# Patient Record
Sex: Female | Born: 1985 | Race: Black or African American | Hispanic: No | Marital: Single | Smoking: Never smoker
Health system: Southern US, Community
[De-identification: ages and names within clinical notes are randomized; demographics above are authoritative.]

## PROBLEM LIST (undated history)

## (undated) ENCOUNTER — Inpatient Hospital Stay (HOSPITAL_COMMUNITY): Payer: Self-pay

## (undated) DIAGNOSIS — O036 Delayed or excessive hemorrhage following complete or unspecified spontaneous abortion: Secondary | ICD-10-CM

## (undated) DIAGNOSIS — Z8619 Personal history of other infectious and parasitic diseases: Secondary | ICD-10-CM

## (undated) DIAGNOSIS — O24419 Gestational diabetes mellitus in pregnancy, unspecified control: Secondary | ICD-10-CM

## (undated) DIAGNOSIS — N83209 Unspecified ovarian cyst, unspecified side: Secondary | ICD-10-CM

## (undated) HISTORY — DX: Personal history of other infectious and parasitic diseases: Z86.19

## (undated) HISTORY — PX: DILATION AND EVACUATION: SHX1459

## (undated) HISTORY — PX: INDUCED ABORTION: SHX677

---
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
## 2007-11-15 ENCOUNTER — Inpatient Hospital Stay (HOSPITAL_COMMUNITY): Admission: AD | Admit: 2007-11-15 | Discharge: 2007-11-15 | Payer: Self-pay | Admitting: Obstetrics

## 2008-05-02 ENCOUNTER — Inpatient Hospital Stay (HOSPITAL_COMMUNITY): Admission: AD | Admit: 2008-05-02 | Discharge: 2008-05-05 | Payer: Self-pay | Admitting: Obstetrics

## 2008-10-18 ENCOUNTER — Emergency Department (HOSPITAL_COMMUNITY): Admission: EM | Admit: 2008-10-18 | Discharge: 2008-10-18 | Payer: Self-pay | Admitting: Family Medicine

## 2009-11-30 ENCOUNTER — Emergency Department (HOSPITAL_COMMUNITY): Admission: EM | Admit: 2009-11-30 | Discharge: 2009-12-01 | Payer: Self-pay | Admitting: Emergency Medicine

## 2009-12-25 ENCOUNTER — Emergency Department (HOSPITAL_COMMUNITY): Admission: EM | Admit: 2009-12-25 | Discharge: 2009-12-25 | Payer: Self-pay | Admitting: Family Medicine

## 2010-07-07 NOTE — L&D Delivery Note (Signed)
Delivery Note At 1:52 PM a viable female was delivered via Vaginal, Spontaneous Delivery (Presentation: ;  ).  APGAR: , ; weight .   Placenta status: , .  Cord:  with the following complications: .  Cord pH: not done  Anesthesia:   Episiotomy:  Lacerations:  Suture Repair: 2.0 Est. Blood Loss (mL):   Mom to postpartum.  Baby to nursery-stable.  MARSHALL,BERNARD A 06/19/2011, 2:01 PM

## 2010-09-23 LAB — URINALYSIS, ROUTINE W REFLEX MICROSCOPIC
Glucose, UA: NEGATIVE mg/dL
Ketones, ur: 15 mg/dL — AB
pH: 6 (ref 5.0–8.0)

## 2010-09-23 LAB — CBC
HCT: 33.7 % — ABNORMAL LOW (ref 36.0–46.0)
Hemoglobin: 11.6 g/dL — ABNORMAL LOW (ref 12.0–15.0)
MCHC: 34.6 g/dL (ref 30.0–36.0)
MCV: 90.6 fL (ref 78.0–100.0)
RDW: 14.2 % (ref 11.5–15.5)

## 2010-09-23 LAB — DIFFERENTIAL
Basophils Absolute: 0 10*3/uL (ref 0.0–0.1)
Basophils Relative: 0 % (ref 0–1)
Eosinophils Relative: 0 % (ref 0–5)
Monocytes Absolute: 0.5 10*3/uL (ref 0.1–1.0)

## 2010-09-23 LAB — BASIC METABOLIC PANEL
CO2: 24 mEq/L (ref 19–32)
Glucose, Bld: 106 mg/dL — ABNORMAL HIGH (ref 70–99)
Potassium: 3.6 mEq/L (ref 3.5–5.1)
Sodium: 134 mEq/L — ABNORMAL LOW (ref 135–145)

## 2010-09-23 LAB — WET PREP, GENITAL: Yeast Wet Prep HPF POC: NONE SEEN

## 2010-09-23 LAB — POCT PREGNANCY, URINE: Preg Test, Ur: NEGATIVE

## 2010-09-23 LAB — URINE CULTURE

## 2010-09-23 LAB — URINE MICROSCOPIC-ADD ON

## 2010-11-04 ENCOUNTER — Inpatient Hospital Stay (HOSPITAL_COMMUNITY)
Admission: AD | Admit: 2010-11-04 | Discharge: 2010-11-04 | Disposition: A | Discharge status: Home / Self Care | Payer: Medicaid Other | Source: Ambulatory Visit | Attending: Obstetrics | Admitting: Obstetrics

## 2010-11-04 ENCOUNTER — Inpatient Hospital Stay (HOSPITAL_COMMUNITY): Payer: Medicaid Other

## 2010-11-04 DIAGNOSIS — R109 Unspecified abdominal pain: Secondary | ICD-10-CM | POA: Insufficient documentation

## 2010-11-04 DIAGNOSIS — O2390 Unspecified genitourinary tract infection in pregnancy, unspecified trimester: Secondary | ICD-10-CM | POA: Insufficient documentation

## 2010-11-04 DIAGNOSIS — B373 Candidiasis of vulva and vagina: Secondary | ICD-10-CM

## 2010-11-04 DIAGNOSIS — B3731 Acute candidiasis of vulva and vagina: Secondary | ICD-10-CM | POA: Insufficient documentation

## 2010-11-04 LAB — COMPREHENSIVE METABOLIC PANEL
ALT: 15 U/L (ref 0–35)
Albumin: 3.9 g/dL (ref 3.5–5.2)
Alkaline Phosphatase: 36 U/L — ABNORMAL LOW (ref 39–117)
Calcium: 9.2 mg/dL (ref 8.4–10.5)
GFR calc Af Amer: >60 mL/min (ref >60)
Glucose, Bld: 66 mg/dL — ABNORMAL LOW (ref 70–99)
Potassium: 3.7 mEq/L (ref 3.5–5.1)
Sodium: 135 mEq/L (ref 135–145)
Total Protein: 7.2 g/dL (ref 6.0–8.3)

## 2010-11-04 LAB — CBC
Platelets: 166 10*3/uL (ref 150–400)
RBC: 4.01 MIL/uL (ref 3.87–5.11)
RDW: 12 % (ref 11.5–15.5)
WBC: 7 10*3/uL (ref 4.0–10.5)

## 2010-11-04 LAB — URINALYSIS, ROUTINE W REFLEX MICROSCOPIC
Ketones, ur: NEGATIVE mg/dL
Nitrite: NEGATIVE
Protein, ur: NEGATIVE mg/dL
Urobilinogen, UA: 0.2 mg/dL (ref 0.0–1.0)
pH: 7.5 (ref 5.0–8.0)

## 2010-11-04 LAB — WET PREP, GENITAL
Clue Cells Wet Prep HPF POC: NONE SEEN
Trich, Wet Prep: NONE SEEN

## 2010-11-04 LAB — POCT PREGNANCY, URINE: Preg Test, Ur: POSITIVE

## 2010-11-04 LAB — HCG, QUANTITATIVE, PREGNANCY: hCG, Beta Chain, Quant, S: 82630 m[IU]/mL — ABNORMAL HIGH (ref <5)

## 2010-11-04 LAB — ABO/RH: ABO/RH(D): A POS

## 2010-11-05 LAB — URINE CULTURE: Colony Count: 45000

## 2010-11-27 ENCOUNTER — Inpatient Hospital Stay (HOSPITAL_COMMUNITY)
Admission: AD | Admit: 2010-11-27 | Discharge: 2010-11-27 | Disposition: A | Discharge status: Home / Self Care | Payer: Medicaid Other | Source: Ambulatory Visit | Attending: Obstetrics | Admitting: Obstetrics

## 2010-11-27 DIAGNOSIS — O2390 Unspecified genitourinary tract infection in pregnancy, unspecified trimester: Secondary | ICD-10-CM

## 2010-11-27 DIAGNOSIS — N390 Urinary tract infection, site not specified: Secondary | ICD-10-CM

## 2010-11-27 DIAGNOSIS — R109 Unspecified abdominal pain: Secondary | ICD-10-CM

## 2010-11-27 LAB — DIFFERENTIAL
Basophils Absolute: 0 10*3/uL (ref 0.0–0.1)
Basophils Relative: 0 % (ref 0–1)
Lymphocytes Relative: 21 % (ref 12–46)
Neutro Abs: 4.7 10*3/uL (ref 1.7–7.7)
Neutrophils Relative %: 71 % (ref 43–77)

## 2010-11-27 LAB — CBC
HCT: 38.1 % (ref 36.0–46.0)
Hemoglobin: 13.1 g/dL (ref 12.0–15.0)
RBC: 4.1 MIL/uL (ref 3.87–5.11)
WBC: 6.6 10*3/uL (ref 4.0–10.5)

## 2010-11-27 LAB — COMPREHENSIVE METABOLIC PANEL
ALT: 13 U/L (ref 0–35)
AST: 15 U/L (ref 0–37)
Alkaline Phosphatase: 36 U/L — ABNORMAL LOW (ref 39–117)
CO2: 25 mEq/L (ref 19–32)
Calcium: 9.6 mg/dL (ref 8.4–10.5)
Chloride: 98 mEq/L (ref 96–112)
GFR calc Af Amer: >60 mL/min (ref >60)
GFR calc non Af Amer: >60 mL/min (ref >60)
Potassium: 4.1 mEq/L (ref 3.5–5.1)
Sodium: 133 mEq/L — ABNORMAL LOW (ref 135–145)
Total Bilirubin: 0.6 mg/dL (ref 0.3–1.2)

## 2010-11-27 LAB — URINALYSIS, ROUTINE W REFLEX MICROSCOPIC
Bilirubin Urine: NEGATIVE
Ketones, ur: 15 mg/dL — AB
Specific Gravity, Urine: 1.025 (ref 1.005–1.030)
Urobilinogen, UA: 0.2 mg/dL (ref 0.0–1.0)
pH: 6.5 (ref 5.0–8.0)

## 2010-11-27 LAB — URINE MICROSCOPIC-ADD ON

## 2010-11-28 LAB — URINE CULTURE: Culture  Setup Time: 201205231905

## 2011-02-11 ENCOUNTER — Other Ambulatory Visit (HOSPITAL_COMMUNITY): Payer: Self-pay | Admitting: Obstetrics

## 2011-02-11 DIAGNOSIS — Z3689 Encounter for other specified antenatal screening: Secondary | ICD-10-CM

## 2011-02-11 LAB — RPR: RPR: NONREACTIVE

## 2011-02-11 LAB — ANTIBODY SCREEN: Antibody Screen: NEGATIVE

## 2011-02-13 ENCOUNTER — Ambulatory Visit (HOSPITAL_COMMUNITY)
Admission: RE | Admit: 2011-02-13 | Discharge: 2011-02-13 | Disposition: A | Discharge status: Home / Self Care | Payer: Medicaid Other | Source: Ambulatory Visit | Attending: Obstetrics | Admitting: Obstetrics

## 2011-02-13 DIAGNOSIS — Z1389 Encounter for screening for other disorder: Secondary | ICD-10-CM | POA: Insufficient documentation

## 2011-02-13 DIAGNOSIS — Z3689 Encounter for other specified antenatal screening: Secondary | ICD-10-CM

## 2011-02-13 DIAGNOSIS — O358XX0 Maternal care for other (suspected) fetal abnormality and damage, not applicable or unspecified: Secondary | ICD-10-CM | POA: Insufficient documentation

## 2011-02-13 DIAGNOSIS — Z363 Encounter for antenatal screening for malformations: Secondary | ICD-10-CM | POA: Insufficient documentation

## 2011-04-05 ENCOUNTER — Inpatient Hospital Stay (HOSPITAL_COMMUNITY)
Admission: AD | Admit: 2011-04-05 | Discharge: 2011-04-05 | Disposition: A | Discharge status: Home / Self Care | Payer: Medicaid Other | Source: Ambulatory Visit | Attending: Obstetrics | Admitting: Obstetrics

## 2011-04-05 ENCOUNTER — Encounter (HOSPITAL_COMMUNITY): Payer: Self-pay

## 2011-04-05 DIAGNOSIS — J069 Acute upper respiratory infection, unspecified: Secondary | ICD-10-CM | POA: Insufficient documentation

## 2011-04-05 DIAGNOSIS — O99891 Other specified diseases and conditions complicating pregnancy: Secondary | ICD-10-CM | POA: Insufficient documentation

## 2011-04-05 DIAGNOSIS — Z331 Pregnant state, incidental: Secondary | ICD-10-CM

## 2011-04-05 DIAGNOSIS — J329 Chronic sinusitis, unspecified: Secondary | ICD-10-CM | POA: Insufficient documentation

## 2011-04-05 HISTORY — DX: Delayed or excessive hemorrhage following complete or unspecified spontaneous abortion: O03.6

## 2011-04-05 LAB — URINE MICROSCOPIC-ADD ON

## 2011-04-05 LAB — URINALYSIS, ROUTINE W REFLEX MICROSCOPIC
Glucose, UA: NEGATIVE mg/dL
Protein, ur: NEGATIVE mg/dL
Specific Gravity, Urine: 1.015 (ref 1.005–1.030)

## 2011-04-05 MED ORDER — CETIRIZINE HCL 10 MG PO CHEW: 10.0000 mg | CHEWABLE_TABLET | Freq: Every day | ORAL | Status: DC

## 2011-04-05 MED ORDER — AZITHROMYCIN 250 MG PO TABS: ORAL_TABLET | ORAL | Status: AC

## 2011-04-05 MED ORDER — BENZONATATE 100 MG PO CAPS: 200.0000 mg | ORAL_CAPSULE | Freq: Two times a day (BID) | ORAL | Status: AC | PRN

## 2011-04-05 NOTE — ED Provider Notes (Signed)
Chief Complaint:  Nasal Congestion, Chest Pain and Cough   Shelley Bass is  25 y.o. [id number].  No LMP recorded. Patient is pregnant..  Her pregnancy status is positive. [redacted]w[redacted]d  She presents complaining of Nasal Congestion, Chest Pain and Cough . Onset is described as gradual and has been present for  2 weeks. Cough and cold symptoms x 2 weeks without relief from OTC meds.    Obstetrical/Gynecological History: OB History    Grav Para Term Preterm Abortions TAB SAB Ect Mult Living   2    1 1    1       Past Medical History: Past Medical History  Diagnosis Date  . Abortion complicated with hemorrhage     Past Surgical History: Past Surgical History  Procedure Date  . Induced abortion     surgical  . Dilation and evacuation     post TAB for complications    Family History: No family history on file.  Social History: History  Substance Use Topics  . Smoking status: Never Smoker   . Smokeless tobacco: Never Used  . Alcohol Use: No    Allergies:  Allergies  Allergen Reactions  . Aspirin Hives    Prescriptions prior to admission  Medication Sig Dispense Refill  . acetaminophen-codeine (TYLENOL #3) 300-30 MG per tablet Take 1 tablet by mouth every 4 (four) hours as needed. For pain       . prenatal vitamin w/FE, FA (PRENATAL 1 + 1) 27-1 MG TABS Take 1 tablet by mouth daily.         Review of Systems - ENT ROS: positive for - nasal congestion, nasal discharge, sinus pain and tinnitus Allergy and Immunology ROS: positive for - nasal congestion and postnasal drip Respiratory ROS: positive for - cough Cardiovascular ROS: no chest pain or dyspnea on exertion  Physical Exam   Blood pressure 123/69, pulse 105, temperature 98.6 F (37 C), temperature source Oral, resp. rate 18, height 5\' 4"  (1.626 m), weight 61.417 kg (135 lb 6.4 oz), SpO2 97.00%.  General: General appearance - alert, well appearing, and in no distress, oriented to person, place, and time and normal  appearing weight Mental status - alert, oriented to person, place, and time, normal mood, behavior, speech, dress, motor activity, and thought processes, affect appropriate to mood Eyes - allergy eyes, with dark circles Ears - Fluid noted behind BL TM, + light reflex, nonbulging Nose - mucosal congestion, mucosal erythema, purulent rhinorrhea and sinus tenderness noted maxillary sinus Chest - clear to auscultation, no wheezes, rales or rhonchi, symmetric air entry Focused Gynecological Exam: examination not indicated  Labs: Recent Results (from the past 24 hour(s))  URINALYSIS, ROUTINE W REFLEX MICROSCOPIC   Collection Time   04/05/11 11:35 AM      Component Value Range   Color, Urine YELLOW  YELLOW    Appearance HAZY (*) CLEAR    Specific Gravity, Urine 1.015  1.005 - 1.030    pH 8.5 (*) 5.0 - 8.0    Glucose, UA NEGATIVE  NEGATIVE (mg/dL)   Hgb urine dipstick NEGATIVE  NEGATIVE    Bilirubin Urine NEGATIVE  NEGATIVE    Ketones, ur 15 (*) NEGATIVE (mg/dL)   Protein, ur NEGATIVE  NEGATIVE (mg/dL)   Urobilinogen, UA 1.0  0.0 - 1.0 (mg/dL)   Nitrite NEGATIVE  NEGATIVE    Leukocytes, UA SMALL (*) NEGATIVE   URINE MICROSCOPIC-ADD ON   Collection Time   04/05/11 11:35 AM      Component  Value Range   Squamous Epithelial / LPF MANY (*) RARE    WBC, UA 7-10  <3 (WBC/hpf)   RBC / HPF 0-2  <3 (RBC/hpf)   Bacteria, UA MANY (*) RARE    Imaging Studies:  No results found.   Assessment: Sinuitis URI Pregnancy  Plan: Rx Z-Pak and Tessalon Perles  FU with Dr. Gaynell Face as scheduled  Shelley Bass E. 04/05/2011,12:47 PM

## 2011-04-05 NOTE — Progress Notes (Signed)
Taking Tylenol 3 has had congestion for 3 weeks, fever at home 99.9, chest pain, no history of asthma

## 2011-04-07 LAB — CBC
HCT: 37.2
MCHC: 33.7
MCV: 95.9
Platelets: 138 — ABNORMAL LOW
RBC: 3.34 — ABNORMAL LOW
RDW: 13.8
RDW: 14

## 2011-05-21 LAB — STREP B DNA PROBE: GBS: NEGATIVE

## 2011-06-17 ENCOUNTER — Telehealth (HOSPITAL_COMMUNITY): Payer: Self-pay | Admitting: *Deleted

## 2011-06-17 ENCOUNTER — Encounter (HOSPITAL_COMMUNITY): Payer: Self-pay | Admitting: *Deleted

## 2011-06-17 NOTE — Telephone Encounter (Signed)
Preadmission screen  

## 2011-06-19 ENCOUNTER — Inpatient Hospital Stay (HOSPITAL_COMMUNITY): Payer: Medicaid Other | Admitting: Anesthesiology

## 2011-06-19 ENCOUNTER — Inpatient Hospital Stay (HOSPITAL_COMMUNITY)
Admission: RE | Admit: 2011-06-19 | Discharge: 2011-06-21 | DRG: 775 | Disposition: A | Discharge status: Home / Self Care | Payer: Medicaid Other | Source: Ambulatory Visit | Attending: Obstetrics | Admitting: Obstetrics

## 2011-06-19 ENCOUNTER — Encounter (HOSPITAL_COMMUNITY): Payer: Self-pay

## 2011-06-19 ENCOUNTER — Encounter (HOSPITAL_COMMUNITY): Payer: Self-pay | Admitting: Anesthesiology

## 2011-06-19 LAB — CBC
MCV: 90.1 fL (ref 78.0–100.0)
Platelets: 199 10*3/uL (ref 150–400)
RDW: 14.5 % (ref 11.5–15.5)
WBC: 7 10*3/uL (ref 4.0–10.5)

## 2011-06-19 MED ORDER — OXYTOCIN 20 UNITS IN LACTATED RINGERS INFUSION - SIMPLE
1.0000 m[IU]/min | INTRAVENOUS | Status: DC
  Administered 2011-06-19: 1 m[IU]/min via INTRAVENOUS
  Filled 2011-06-19: qty 1000

## 2011-06-19 MED ORDER — FLEET ENEMA 7-19 GM/118ML RE ENEM: 1.0000 | ENEMA | RECTAL | Status: DC | PRN

## 2011-06-19 MED ORDER — FERROUS SULFATE 325 (65 FE) MG PO TABS
325.0000 mg | ORAL_TABLET | Freq: Two times a day (BID) | ORAL | Status: DC
  Administered 2011-06-19 – 2011-06-21 (×4): 325 mg via ORAL
  Filled 2011-06-19 (×4): qty 1

## 2011-06-19 MED ORDER — FENTANYL 2.5 MCG/ML BUPIVACAINE 1/10 % EPIDURAL INFUSION (WH - ANES)
INTRAMUSCULAR | Status: DC | PRN
  Administered 2011-06-19: 14 mL/h via EPIDURAL

## 2011-06-19 MED ORDER — EPHEDRINE 5 MG/ML INJ
10.0000 mg | INTRAVENOUS | Status: DC | PRN
  Filled 2011-06-19: qty 4

## 2011-06-19 MED ORDER — BENZOCAINE-MENTHOL 20-0.5 % EX AERO: 1.0000 "application " | INHALATION_SPRAY | CUTANEOUS | Status: DC | PRN

## 2011-06-19 MED ORDER — ZOLPIDEM TARTRATE 5 MG PO TABS: 5.0000 mg | ORAL_TABLET | Freq: Every evening | ORAL | Status: DC | PRN

## 2011-06-19 MED ORDER — TETANUS-DIPHTH-ACELL PERTUSSIS 5-2.5-18.5 LF-MCG/0.5 IM SUSP: 0.5000 mL | Freq: Once | INTRAMUSCULAR | Status: DC

## 2011-06-19 MED ORDER — IBUPROFEN 600 MG PO TABS: 600.0000 mg | ORAL_TABLET | Freq: Four times a day (QID) | ORAL | Status: DC | PRN

## 2011-06-19 MED ORDER — DIPHENHYDRAMINE HCL 25 MG PO CAPS: 25.0000 mg | ORAL_CAPSULE | Freq: Four times a day (QID) | ORAL | Status: DC | PRN

## 2011-06-19 MED ORDER — FENTANYL 2.5 MCG/ML BUPIVACAINE 1/10 % EPIDURAL INFUSION (WH - ANES)
14.0000 mL/h | INTRAMUSCULAR | Status: DC
  Filled 2011-06-19: qty 60

## 2011-06-19 MED ORDER — ONDANSETRON HCL 4 MG/2ML IJ SOLN: 4.0000 mg | INTRAMUSCULAR | Status: DC | PRN

## 2011-06-19 MED ORDER — ONDANSETRON HCL 4 MG/2ML IJ SOLN: 4.0000 mg | Freq: Four times a day (QID) | INTRAMUSCULAR | Status: DC | PRN

## 2011-06-19 MED ORDER — WITCH HAZEL-GLYCERIN EX PADS: 1.0000 "application " | MEDICATED_PAD | CUTANEOUS | Status: DC | PRN

## 2011-06-19 MED ORDER — OXYTOCIN BOLUS FROM INFUSION
500.0000 mL | Freq: Once | INTRAVENOUS | Status: DC
  Filled 2011-06-19: qty 500

## 2011-06-19 MED ORDER — LACTATED RINGERS IV SOLN: 500.0000 mL | INTRAVENOUS | Status: DC | PRN

## 2011-06-19 MED ORDER — DIPHENHYDRAMINE HCL 50 MG/ML IJ SOLN: 12.5000 mg | INTRAMUSCULAR | Status: DC | PRN

## 2011-06-19 MED ORDER — SIMETHICONE 80 MG PO CHEW: 80.0000 mg | CHEWABLE_TABLET | ORAL | Status: DC | PRN

## 2011-06-19 MED ORDER — DIBUCAINE 1 % RE OINT: 1.0000 "application " | TOPICAL_OINTMENT | RECTAL | Status: DC | PRN

## 2011-06-19 MED ORDER — PHENYLEPHRINE 40 MCG/ML (10ML) SYRINGE FOR IV PUSH (FOR BLOOD PRESSURE SUPPORT)
80.0000 ug | PREFILLED_SYRINGE | INTRAVENOUS | Status: DC | PRN
  Filled 2011-06-19: qty 5

## 2011-06-19 MED ORDER — EPHEDRINE 5 MG/ML INJ: 10.0000 mg | INTRAVENOUS | Status: DC | PRN

## 2011-06-19 MED ORDER — LIDOCAINE HCL (PF) 1 % IJ SOLN
30.0000 mL | INTRAMUSCULAR | Status: DC | PRN
  Filled 2011-06-19: qty 30

## 2011-06-19 MED ORDER — TERBUTALINE SULFATE 1 MG/ML IJ SOLN: 0.2500 mg | Freq: Once | INTRAMUSCULAR | Status: DC | PRN

## 2011-06-19 MED ORDER — OXYCODONE-ACETAMINOPHEN 5-325 MG PO TABS
1.0000 | ORAL_TABLET | ORAL | Status: DC | PRN
Start: 2011-06-19 — End: 2011-06-21
  Administered 2011-06-19 – 2011-06-21 (×6): 1 via ORAL
  Filled 2011-06-19 (×2): qty 1

## 2011-06-19 MED ORDER — OXYTOCIN 20 UNITS IN LACTATED RINGERS INFUSION - SIMPLE: 125.0000 mL/h | Freq: Once | INTRAVENOUS | Status: DC

## 2011-06-19 MED ORDER — PHENYLEPHRINE 40 MCG/ML (10ML) SYRINGE FOR IV PUSH (FOR BLOOD PRESSURE SUPPORT): 80.0000 ug | PREFILLED_SYRINGE | INTRAVENOUS | Status: DC | PRN

## 2011-06-19 MED ORDER — CITRIC ACID-SODIUM CITRATE 334-500 MG/5ML PO SOLN: 30.0000 mL | ORAL | Status: DC | PRN

## 2011-06-19 MED ORDER — BUTORPHANOL TARTRATE 2 MG/ML IJ SOLN: 1.0000 mg | INTRAMUSCULAR | Status: DC | PRN

## 2011-06-19 MED ORDER — LACTATED RINGERS IV SOLN
500.0000 mL | Freq: Once | INTRAVENOUS | Status: AC
  Administered 2011-06-19: 500 mL via INTRAVENOUS

## 2011-06-19 MED ORDER — ACETAMINOPHEN 325 MG PO TABS: 650.0000 mg | ORAL_TABLET | ORAL | Status: DC | PRN

## 2011-06-19 MED ORDER — LACTATED RINGERS IV SOLN
INTRAVENOUS | Status: DC
  Administered 2011-06-19 (×2): via INTRAVENOUS

## 2011-06-19 MED ORDER — IBUPROFEN 600 MG PO TABS
600.0000 mg | ORAL_TABLET | Freq: Four times a day (QID) | ORAL | Status: DC
  Administered 2011-06-19 – 2011-06-21 (×7): 600 mg via ORAL
  Filled 2011-06-19 (×7): qty 1

## 2011-06-19 MED ORDER — OXYCODONE-ACETAMINOPHEN 5-325 MG PO TABS
2.0000 | ORAL_TABLET | ORAL | Status: DC | PRN
  Administered 2011-06-20: 2 via ORAL
  Filled 2011-06-19: qty 1
  Filled 2011-06-19: qty 2
  Filled 2011-06-19 (×3): qty 1

## 2011-06-19 MED ORDER — PRENATAL PLUS 27-1 MG PO TABS
1.0000 | ORAL_TABLET | Freq: Every day | ORAL | Status: DC
  Administered 2011-06-19 – 2011-06-21 (×3): 1 via ORAL
  Filled 2011-06-19 (×3): qty 1

## 2011-06-19 MED ORDER — LANOLIN HYDROUS EX OINT: TOPICAL_OINTMENT | CUTANEOUS | Status: DC | PRN

## 2011-06-19 MED ORDER — SENNOSIDES-DOCUSATE SODIUM 8.6-50 MG PO TABS
2.0000 | ORAL_TABLET | Freq: Every day | ORAL | Status: DC
  Administered 2011-06-19 – 2011-06-20 (×2): 2 via ORAL

## 2011-06-19 MED ORDER — SODIUM BICARBONATE 8.4 % IV SOLN
INTRAVENOUS | Status: DC | PRN
  Administered 2011-06-19: 5 mL via EPIDURAL

## 2011-06-19 MED ORDER — ONDANSETRON HCL 4 MG PO TABS: 4.0000 mg | ORAL_TABLET | ORAL | Status: DC | PRN

## 2011-06-19 NOTE — H&P (Signed)
This is Dr. Francoise Ceo dictating the history and physical on  Shelley Bass she's a 25 year old gravida 3 para 1011 at 40 weeks appendixes 12th 2012 negative GBS she was admitted for induction of the cervix is 4 cm Prudhomme progressed rapidly and had a normal vaginal delivery female 8 and 9 placenta spontaneous no episiotomy or laceration past medical history was negative Puffs Past surgical history negative Social history negative And system review negative And physical exam well-developed female in no distress HEENT negative Lungs clear Breasts no masses Abdomen uterus 20 week size postpartum Legs negative

## 2011-06-19 NOTE — Anesthesia Preprocedure Evaluation (Signed)

## 2011-06-19 NOTE — Anesthesia Procedure Notes (Signed)

## 2011-06-19 NOTE — Consult Note (Signed)
Neonatology Note:  Attendance at Code Apgar:   Our team responded to a Code Apgar call to room # 173 following NSVD, due to infant with apnea. The baby is full term and there were no risk factors for infection, no fetal distress. At about 5-6 minutes, the baby was noted to be apneic with a HR of 80 by OB nurse, so a Code Apgar was called. We arrived at 8 minutes of life, at which time the baby had responded to PPV (done by [facility] nurse) and stimulation. She was pink, breathing regularly, HR good with good perfusion. I observed her for another 3-4 minutes and she remained asymptomatic. Lungs clear to ausc.I spoke with the mother in the DR, then transferred the baby to the Pediatrician's care. Mellody Memos, MD

## 2011-06-20 LAB — CBC
HCT: 31.4 % — ABNORMAL LOW (ref 36.0–46.0)
MCV: 90 fL (ref 78.0–100.0)
RDW: 14.9 % (ref 11.5–15.5)
WBC: 10.1 10*3/uL (ref 4.0–10.5)

## 2011-06-20 NOTE — Progress Notes (Signed)
UR chart review completed.  

## 2011-06-20 NOTE — Anesthesia Postprocedure Evaluation (Signed)
Anesthesia Post Note  Patient: Shelley Bass  Procedure(s) Performed: * No procedures listed *  Anesthesia type: Epidural  Patient location: Mother/Baby  Post pain: Pain level controlled  Post assessment: Post-op Vital signs reviewed  Last Vitals:  Filed Vitals:   06/20/11 0517  BP: 113/70  Pulse: 77  Temp: 36.5 C  Resp: 20    Post vital signs: Reviewed  Level of consciousness:alert  Complications: No apparent anesthesia complications

## 2011-06-21 MED ORDER — MEDROXYPROGESTERONE ACETATE 150 MG/ML IM SUSP
150.0000 mg | Freq: Once | INTRAMUSCULAR | Status: AC
  Administered 2011-06-21: 150 mg via INTRAMUSCULAR
  Filled 2011-06-21: qty 1

## 2011-06-21 MED ORDER — PRENATAL PLUS 27-1 MG PO TABS: 1.0000 | ORAL_TABLET | Freq: Every day | ORAL | Status: DC

## 2011-06-21 MED ORDER — IBUPROFEN 600 MG PO TABS: 600.0000 mg | ORAL_TABLET | Freq: Four times a day (QID) | ORAL | Status: AC

## 2011-06-21 MED ORDER — OXYCODONE-ACETAMINOPHEN 5-325 MG PO TABS: 2.0000 | ORAL_TABLET | Freq: Four times a day (QID) | ORAL | Status: AC | PRN

## 2011-06-21 NOTE — Discharge Summary (Signed)
  Obstetric Discharge Summary Reason for Admission: onset of labor Prenatal Procedures: none Intrapartum Procedures: spontaneous vaginal delivery Postpartum Procedures: none Complications-Operative and Postpartum: none  Hemoglobin  Date Value Range Status  06/20/2011 10.1* 12.0-15.0 (g/dL) Final     HCT  Date Value Range Status  06/20/2011 31.4* 36.0-46.0 (%) Final    Discharge Diagnoses: Term Pregnancy-delivered  Discharge Information: Date: 06/21/2011 Activity: pelvic rest Diet: routine Medications:  Prior to Admission medications   Medication Sig Start Date End Date Taking? Authorizing Provider  ibuprofen (ADVIL,MOTRIN) 600 MG tablet Take 1 tablet (600 mg total) by mouth every 6 (six) hours. 06/21/11 07/01/11  Roseanna Rainbow, MD  oxyCODONE-acetaminophen (PERCOCET) 5-325 MG per tablet Take 2 tablets by mouth every 6 (six) hours as needed (for pain scale > 4). 06/21/11 07/01/11  Roseanna Rainbow, MD  prenatal vitamin w/FE, FA (PRENATAL 1 + 1) 27-1 MG TABS Take 1 tablet by mouth daily. 06/21/11   Roseanna Rainbow, MD    Condition: stable Instructions: refer to routine discharge instructions Discharge to: home Follow-up Information    Follow up with MARSHALL,BERNARD A, MD. Make an appointment in 6 weeks.   Contact information:   [address] [place] [address] [phone]          Newborn Data: Live born  Information for the patient's newborn:  Mack, Girl Caliana [[id number]]  female ; APGAR , ; weight ;  Home with mother.  JACKSON-MOORE,Herminio Kniskern A 06/21/2011, 10:44 AM

## 2011-06-21 NOTE — Progress Notes (Signed)
Post Partum Day #2 S/P:spontaneous vaginal  RH status/Rubella reviewed.  Feeding: breast Subjective: No HA, SOB, CP, F/C, breast symptoms: no. Normal vaginal bleeding, no clots.     Objective:  Blood pressure 106/69, pulse 85, temperature 97.9 F (36.6 C), temperature source Oral, resp. rate 18, height 5\' 4"  (1.626 m), weight 70.308 kg (155 lb), SpO2 97.00%, unknown if currently breastfeeding.   Physical Exam:  General: alert Lochia: appropriate Uterine Fundus: firm DVT Evaluation: No evidence of DVT seen on physical exam. Ext: No c/c/e  Basename 06/20/11 0525 06/19/11 0810  HGB 10.1* 10.9*  HCT 31.4* 33.5*    Assessment/Plan: 25 y.o.  PPD # 2 .  normal postpartum exam patient is a candidate for Depo-Provera for contraception, with no contraindications Continue current postpartum care D/C home   LOS: 2 days   JACKSON-MOORE,Goebel Hellums A 06/21/2011, 10:38 AM

## 2014-05-08 ENCOUNTER — Encounter (HOSPITAL_COMMUNITY): Payer: Self-pay

## 2014-11-01 LAB — HM PAP SMEAR: HM PAP: NEGATIVE

## 2016-02-23 ENCOUNTER — Encounter: Payer: Self-pay | Admitting: *Deleted

## 2016-03-05 ENCOUNTER — Ambulatory Visit (INDEPENDENT_AMBULATORY_CARE_PROVIDER_SITE_OTHER): Payer: BLUE CROSS/BLUE SHIELD | Admitting: Family Medicine

## 2016-03-05 ENCOUNTER — Encounter: Payer: Self-pay | Admitting: Family Medicine

## 2016-03-05 VITALS — BP 132/62 | HR 71 | Temp 98.0°F | Resp 16 | Ht 64.0 in | Wt 152.0 lb

## 2016-03-05 DIAGNOSIS — Z0000 Encounter for general adult medical examination without abnormal findings: Secondary | ICD-10-CM

## 2016-03-05 DIAGNOSIS — IMO0001 Reserved for inherently not codable concepts without codable children: Secondary | ICD-10-CM

## 2016-03-05 NOTE — Progress Notes (Signed)
       Shelley Bass is a 30 y.o. female who [facility] today for well adult visit.  Patient is doing well with no active medical problems. She notes that she's gained some weight last several months which she started a new desk job and has been less active and eating more than usual. She has normal menstrual periods and is not currently using birth control reliably with her long-term partner. She has 2 children and does not mind if she becomes pregnant but is not actively planning to become pregnant. She is not currently taking prenatal vitamins. No fevers chills nausea vomiting or diarrhea.   Past Medical History:  Diagnosis Date  . Abortion complicated with hemorrhage   . History of chlamydia    Past Surgical History:  Procedure Laterality Date  . DILATION AND EVACUATION     post TAB for complications  . INDUCED ABORTION     surgical   Social History  Substance Use Topics  . Smoking status: Never Smoker  . Smokeless tobacco: Never Used  . Alcohol use No   family history includes Arthritis in her mother; Diabetes in her paternal grandmother; Hypertension in her father; Miscarriages / [place] in her mother; Vision loss in her maternal grandmother.  ROS as above: No headache, visual changes, nausea, vomiting, diarrhea, constipation, dizziness, abdominal pain, skin rash, fevers, chills, night sweats, weight loss, swollen lymph nodes, body aches, joint swelling, muscle aches, chest pain, shortness of breath, mood changes, visual or auditory hallucinations.    Medications: Current Outpatient Prescriptions  Medication Sig Dispense Refill  . prenatal vitamin w/FE, FA (PRENATAL 1 + 1) 27-1 MG TABS Take 1 tablet by mouth daily. 30 each 11   No current facility-administered medications for this visit.    Allergies  Allergen Reactions  . Aspirin Hives       Exam:  BP 132/62 (BP Location: Right Arm, Patient Position: Sitting, Cuff Size: Normal)   Pulse 71   Temp 98 F (36.7 C) (Oral)   Resp 16   Ht 5\' 4"  (1.626 m)   Wt 152 lb 0.6 oz (69 kg)   SpO2 98%   BMI 26.10 kg/m  Gen: Well NAD HEENT: EOMI,  MMM Lungs: Normal work of breathing. CTABL Heart: RRR no MRG Abd: NABS, Soft. Nondistended, Nontender Exts: Brisk capillary refill, warm and well perfused.   No results found for this or any previous visit (from the past 24 hour(s)). No results found.    Assessment and Plan: 30 y.o. female with Well adult. Discussed contraceptive methods. Patient elects for none. Recommend taking prenatal vitamins. Check basic fasting labs. Return in one year or sooner if needed.   Orders Placed This Encounter  Procedures  . CBC  . Comprehensive metabolic panel    Order Specific Question:   Has the patient fasted?    Answer:   No  . Lipid panel    Order Specific Question:   Has the patient fasted?    Answer:   No  . Hemoglobin A1c  . VITAMIN D 25 Hydroxy (Vit-D Deficiency, Fractures)    Discussed warning signs or symptoms. Please see discharge instructions. Patient expresses understanding.

## 2016-03-05 NOTE — Patient Instructions (Signed)
Thank you for coming in today. Take prenatal vitamins.  Get fasting labs soon.  Return in 1 year or sooner if needed.  Avoid carbohydrates.

## 2016-03-05 NOTE — Progress Notes (Signed)
Here to establish care

## 2016-07-07 NOTE — L&D Delivery Note (Signed)
Patient was C/C/+3 and pushed for 15 minutes with epidural.   NSVD  female infant, Apgars 8,9, weight P.   The patient had no lacerations. Fundus was firm. EBL was expected amount. Placenta was delivered intact. Vagina was clear.  Baby was vigorous and doing skin to skin with mother.  Ayaan Ringle A

## 2016-08-04 ENCOUNTER — Emergency Department (HOSPITAL_BASED_OUTPATIENT_CLINIC_OR_DEPARTMENT_OTHER): Payer: Medicaid Other

## 2016-08-04 ENCOUNTER — Emergency Department (HOSPITAL_BASED_OUTPATIENT_CLINIC_OR_DEPARTMENT_OTHER)
Admission: EM | Admit: 2016-08-04 | Discharge: 2016-08-04 | Disposition: A | Discharge status: Home / Self Care | Payer: Medicaid Other | Attending: Emergency Medicine | Admitting: Emergency Medicine

## 2016-08-04 ENCOUNTER — Encounter (HOSPITAL_BASED_OUTPATIENT_CLINIC_OR_DEPARTMENT_OTHER): Payer: Self-pay | Admitting: *Deleted

## 2016-08-04 DIAGNOSIS — O209 Hemorrhage in early pregnancy, unspecified: Secondary | ICD-10-CM

## 2016-08-04 DIAGNOSIS — Z3A08 8 weeks gestation of pregnancy: Secondary | ICD-10-CM | POA: Insufficient documentation

## 2016-08-04 DIAGNOSIS — R1030 Lower abdominal pain, unspecified: Secondary | ICD-10-CM | POA: Diagnosis not present

## 2016-08-04 DIAGNOSIS — O200 Threatened abortion: Secondary | ICD-10-CM | POA: Diagnosis not present

## 2016-08-04 LAB — URINALYSIS, ROUTINE W REFLEX MICROSCOPIC
Bilirubin Urine: NEGATIVE
GLUCOSE, UA: NEGATIVE mg/dL
Ketones, ur: NEGATIVE mg/dL
Nitrite: NEGATIVE
PH: 6.5 (ref 5.0–8.0)
Protein, ur: NEGATIVE mg/dL
Specific Gravity, Urine: 1.021 (ref 1.005–1.030)

## 2016-08-04 LAB — HCG, QUANTITATIVE, PREGNANCY: HCG, BETA CHAIN, QUANT, S: 163207 m[IU]/mL — AB (ref <5)

## 2016-08-04 LAB — CBC
HEMATOCRIT: 38 % (ref 36.0–46.0)
Hemoglobin: 12.9 g/dL (ref 12.0–15.0)
MCH: 31.9 pg (ref 26.0–34.0)
MCHC: 33.9 g/dL (ref 30.0–36.0)
MCV: 93.8 fL (ref 78.0–100.0)
PLATELETS: 182 10*3/uL (ref 150–400)
RBC: 4.05 MIL/uL (ref 3.87–5.11)
RDW: 12.4 % (ref 11.5–15.5)
WBC: 8.4 10*3/uL (ref 4.0–10.5)

## 2016-08-04 LAB — BASIC METABOLIC PANEL
ANION GAP: 8 (ref 5–15)
BUN: 9 mg/dL (ref 6–20)
CO2: 24 mmol/L (ref 22–32)
CREATININE: 0.74 mg/dL (ref 0.44–1.00)
Calcium: 9.5 mg/dL (ref 8.9–10.3)
Chloride: 102 mmol/L (ref 101–111)
Glucose, Bld: 88 mg/dL (ref 65–99)
Potassium: 3.7 mmol/L (ref 3.5–5.1)
SODIUM: 134 mmol/L — AB (ref 135–145)

## 2016-08-04 LAB — URINALYSIS, MICROSCOPIC (REFLEX)

## 2016-08-04 LAB — PREGNANCY, URINE: Preg Test, Ur: POSITIVE — AB

## 2016-08-04 MED ORDER — NAT-RUL PRENATAL VITAMINS 28-0.8 MG PO TABS: 1.0000 | ORAL_TABLET | Freq: Every day | ORAL | 3 refills | Status: DC

## 2016-08-04 MED ORDER — PROMETHAZINE HCL 25 MG PO TABS: 25.0000 mg | ORAL_TABLET | Freq: Four times a day (QID) | ORAL | 2 refills | Status: DC | PRN

## 2016-08-04 NOTE — ED Notes (Signed)
Pt returned from US

## 2016-08-04 NOTE — ED Provider Notes (Signed)
[facility] Provider Note   CSN: [id number] Arrival date & time: 08/04/16  1525  By signing my name below, I, Teofilo Pod, attest that this documentation has been prepared under the direction and in the presence of Vanetta Mulders, MD . Electronically Signed: Teofilo Pod, ED Scribe. 08/04/2016. 6:33 PM.    History   Chief Complaint Chief Complaint  Patient presents with  . Abdominal Pain    The history is provided by the patient. No language interpreter was used.   HPI Comments:  Shelley Bass is a 31 y.o. female who presents to the Emergency Department complaining of constant lower abdominal pain x 1 month. Pt complains of associated nausea, vomiting, sore throat, light vaginal discharge, vaginal bleeding x 1 week, hematuria, headache, ankle swelling. LNMP was 06/06/16, and is G4P2A1. Pt had a positive pregnancy test at the ED today. Pt is blood type-A positive. No alleviating factors noted. Pt denies fever, rhinorrhea, visual changes, cough, SOB, chest pain, diarrhea, dysuria, and bleeding easily.    Past Medical History:  Diagnosis Date  . Abortion complicated with hemorrhage   . History of chlamydia     There are no active problems to display for this patient.   Past Surgical History:  Procedure Laterality Date  . DILATION AND EVACUATION     post TAB for complications  . INDUCED ABORTION     surgical    OB History    Gravida Para Term Preterm AB Living   3 2 2  0 1 2   SAB TAB Ectopic Multiple Live Births   0 1 0 0 2       Home Medications    Prior to Admission medications   Medication Sig Start Date End Date Taking? Authorizing Provider  Prenatal Vit-Fe Fumarate-FA (NAT-RUL PRENATAL VITAMINS) 28-0.8 MG TABS Take 1 tablet by mouth daily. 08/04/16   Vanetta Mulders, MD  prenatal vitamin w/FE, FA (PRENATAL 1 + 1) 27-1 MG TABS Take 1 tablet by mouth daily. 06/21/11   Antionette Char, MD  promethazine (PHENERGAN) 25 MG tablet Take  1 tablet (25 mg total) by mouth every 6 (six) hours as needed for nausea or vomiting. 08/04/16   Vanetta Mulders, MD    Family History Family History  Problem Relation Age of Onset  . Arthritis Mother   . Miscarriages / [place] Mother   . Hypertension Father   . Vision loss Maternal Grandmother     cataracts  . Diabetes Paternal Grandmother   . Anesthesia problems Neg Hx   . Hypotension Neg Hx   . Malignant hyperthermia Neg Hx   . Pseudochol deficiency Neg Hx     Social History Social History  Substance Use Topics  . Smoking status: Never Smoker  . Smokeless tobacco: Never Used  . Alcohol use No     Allergies   Aspirin   Review of Systems Review of Systems  Constitutional: Negative for fever.  HENT: Positive for sore throat. Negative for rhinorrhea.   Eyes: Negative for visual disturbance.  Respiratory: Negative for cough and shortness of breath.   Cardiovascular: Positive for leg swelling (Ankles). Negative for chest pain.  Gastrointestinal: Positive for abdominal pain, nausea and vomiting. Negative for diarrhea.  Genitourinary: Positive for hematuria, vaginal bleeding and vaginal discharge. Negative for dysuria.  Neurological: Positive for headaches.  Hematological: Does not bruise/bleed easily.  All other systems reviewed and are negative.    Physical Exam Updated Vital Signs BP 105/60 (BP Location: Right Arm)  Pulse 74   Temp 98.4 F (36.9 C) (Oral)   Resp 18   Ht 5\' 4"  (1.626 m)   Wt 68 kg   LMP 06/06/2016   SpO2 99%   BMI 25.75 kg/m   Physical Exam  Constitutional: She appears well-developed and well-nourished. No distress.  HENT:  Head: Normocephalic and atraumatic.  Mouth/Throat: Oropharynx is clear and moist.  Eyes: Conjunctivae and EOM are normal. Pupils are equal, round, and reactive to light.  Sclera clear, eyes tracking normally.   Neck: Neck supple.  Cardiovascular: Normal rate, regular rhythm and normal heart sounds.   No murmur  heard. Pulmonary/Chest: Effort normal and breath sounds normal. No respiratory distress.  Lungs clear bilaterally.  Abdominal: Soft. Bowel sounds are normal. There is no tenderness.  Musculoskeletal: She exhibits no edema.  Neurological: She is alert. No cranial nerve deficit or sensory deficit. She exhibits normal muscle tone. Coordination normal.  Skin: Skin is warm and dry.  Psychiatric: She has a normal mood and affect.  Nursing note and vitals reviewed.    ED Treatments / Results  DIAGNOSTIC STUDIES:  Oxygen Saturation is 100% on RA, normal by my interpretation.    COORDINATION OF CARE:  6:27 PM Will order ultrasound. Discussed treatment plan with pt at bedside and pt agreed to plan.   Labs (all labs ordered are listed, but only abnormal results are displayed) Labs Reviewed  URINALYSIS, ROUTINE W REFLEX MICROSCOPIC - Abnormal; Notable for the following:       Result Value   APPearance CLOUDY (*)    Hgb urine dipstick TRACE (*)    Leukocytes, UA MODERATE (*)    All other components within normal limits  PREGNANCY, URINE - Abnormal; Notable for the following:    Preg Test, Ur POSITIVE (*)    All other components within normal limits  URINALYSIS, MICROSCOPIC (REFLEX) - Abnormal; Notable for the following:    Bacteria, UA MANY (*)    Squamous Epithelial / LPF 6-30 (*)    All other components within normal limits  HCG, QUANTITATIVE, PREGNANCY - Abnormal; Notable for the following:    hCG, Beta Chain, Quant, S [id number] (*)    All other components within normal limits  BASIC METABOLIC PANEL - Abnormal; Notable for the following:    Sodium 134 (*)    All other components within normal limits  CBC  .Marland Kitchen... Results for orders placed or performed during the hospital encounter of 08/04/16  Urinalysis, Routine w reflex microscopic  Result Value Ref Range   Color, Urine YELLOW YELLOW   APPearance CLOUDY (A) CLEAR   Specific Gravity, Urine 1.021 1.005 - 1.030   pH 6.5 5.0 - 8.0     Glucose, UA NEGATIVE NEGATIVE mg/dL   Hgb urine dipstick TRACE (A) NEGATIVE   Bilirubin Urine NEGATIVE NEGATIVE   Ketones, ur NEGATIVE NEGATIVE mg/dL   Protein, ur NEGATIVE NEGATIVE mg/dL   Nitrite NEGATIVE NEGATIVE   Leukocytes, UA MODERATE (A) NEGATIVE  Pregnancy, urine  Result Value Ref Range   Preg Test, Ur POSITIVE (A) NEGATIVE  Urinalysis, Microscopic (reflex)  Result Value Ref Range   RBC / HPF 0-5 0 - 5 RBC/hpf   WBC, UA 0-5 0 - 5 WBC/hpf   Bacteria, UA MANY (A) NONE SEEN   Squamous Epithelial / LPF 6-30 (A) NONE SEEN   Mucous PRESENT   hCG, quantitative, pregnancy  Result Value Ref Range   hCG, Beta Chain, Quant, S 163,207 (H) <5 mIU/mL  CBC  Result  Value Ref Range   WBC 8.4 4.0 - 10.5 K/uL   RBC 4.05 3.87 - 5.11 MIL/uL   Hemoglobin 12.9 12.0 - 15.0 g/dL   HCT 14.7 82.9 - 56.2 %   MCV 93.8 78.0 - 100.0 fL   MCH 31.9 26.0 - 34.0 pg   MCHC 33.9 30.0 - 36.0 g/dL   RDW 13.0 86.5 - 78.4 %   Platelets 182 150 - 400 K/uL  Basic metabolic panel  Result Value Ref Range   Sodium 134 (L) 135 - 145 mmol/L   Potassium 3.7 3.5 - 5.1 mmol/L   Chloride 102 101 - 111 mmol/L   CO2 24 22 - 32 mmol/L   Glucose, Bld 88 65 - 99 mg/dL   BUN 9 6 - 20 mg/dL   Creatinine, Ser 6.96 0.44 - 1.00 mg/dL   Calcium 9.5 8.9 - 29.5 mg/dL   GFR calc non Af Amer >60 >60 mL/min   GFR calc Af Amer >60 >60 mL/min   Anion gap 8 5 - 15     EKG  EKG Interpretation None       Radiology US Ob Comp Less 14 Wks  Result Date: 08/04/2016 CLINICAL DATA:  Chronic pelvic pain. Acute onset of vaginal bleeding. Initial encounter. EXAM: OBSTETRIC <14 WK ULTRASOUND TECHNIQUE: Transabdominal ultrasound was performed for evaluation of the gestation as well as the maternal uterus and adnexal regions. COMPARISON:  Pelvic ultrasound performed 02/13/2011 FINDINGS: Intrauterine gestational sac: Single; visualized and normal in shape. Yolk sac:  Yes Embryo:  Yes Cardiac Activity: Yes Heart Rate: 171 bpm CRL:    1.7 cm   8 w 2 d                  [place] EDC: 03/14/2017 Subchorionic hemorrhage:  None visualized. Maternal uterus/adnexae: The uterus is otherwise unremarkable in appearance. The cervix remains closed. The ovaries are within normal limits. The right ovary measures 3.8 x 1.6 x 1.6 cm, while the left ovary measures 3.0 x 2.9 x 2.4 cm. No suspicious adnexal masses are seen; there is no evidence for ovarian torsion. No free fluid is seen within the pelvic cul-de-sac. IMPRESSION: Single live intrauterine pregnancy noted, with a crown-rump length of 1.7 cm, corresponding to a gestational age of [redacted] weeks 2 days. This matches the gestational age of [redacted] weeks 3 days by LMP, reflecting an estimated date of delivery of March 13, 2017. Electronically Signed   By: Roanna Raider M.D.   On: 08/04/2016 20:02    Procedures Procedures (including critical care time)  Medications Ordered in ED Medications - No data to display   Initial Impression / Assessment and Plan / ED Course  I have reviewed the triage vital signs and the nursing notes.  Pertinent labs & imaging results that were available during my care of the patient were reviewed by me and considered in my medical decision making (see chart for details).    Urinalysis is not consistent with urinary tract infection however urine culture sent. Patient with positive pregnancy test. Ultrasound shows about an 8 week live intrauterine pregnancy. The patient is having some abdominal pain and bleeding which could possibly mean concerns for threatened miscarriage. No concerns for ectopic pregnancy at this time. Patient will be started on prenatal vitamins and Phenergan for the nausea and vomiting and she'll follow-up with OB/GYN. Work note provided.   Final Clinical Impressions(s) / ED Diagnoses   Final diagnoses:  [redacted] weeks gestation of pregnancy  Threatened miscarriage in  early pregnancy    New Prescriptions New Prescriptions   PRENATAL VIT-FE FUMARATE-FA  (NAT-RUL PRENATAL VITAMINS) 28-0.8 MG TABS    Take 1 tablet by mouth daily.   PROMETHAZINE (PHENERGAN) 25 MG TABLET    Take 1 tablet (25 mg total) by mouth every 6 (six) hours as needed for nausea or vomiting.     Vanetta MuldersScott Anaika Santillano, MD 08/04/16 2039

## 2016-08-04 NOTE — ED Notes (Signed)
Patient transported to Ultrasound 

## 2016-08-04 NOTE — ED Notes (Signed)
Pt verbalizes understanding of d/c instructions and denies any further needs at this time. 

## 2016-08-04 NOTE — ED Notes (Signed)
Pt is A positive blood type per lab

## 2016-08-04 NOTE — ED Triage Notes (Signed)
Lower abdominal pain and vaginal discharge.

## 2016-08-04 NOTE — Discharge Instructions (Signed)
Ultrasound shows a 8 week live pregnancy but due to the bleeding and pain and we call it a threatened miscarriage is a possibility. Make appointment to follow-up with OB/GYN. Take Phenergan as needed for nausea and vomiting. Take prenatal vitamins daily. Return for any new or worse symptoms.

## 2016-08-06 LAB — URINE CULTURE

## 2016-08-08 ENCOUNTER — Inpatient Hospital Stay (HOSPITAL_COMMUNITY)
Admission: AD | Admit: 2016-08-08 | Discharge: 2016-08-08 | Disposition: A | Discharge status: Home / Self Care | Payer: Medicaid Other | Source: Ambulatory Visit | Attending: Obstetrics & Gynecology | Admitting: Obstetrics & Gynecology

## 2016-08-08 ENCOUNTER — Encounter (HOSPITAL_COMMUNITY): Payer: Self-pay | Admitting: *Deleted

## 2016-08-08 DIAGNOSIS — O26891 Other specified pregnancy related conditions, first trimester: Secondary | ICD-10-CM | POA: Diagnosis not present

## 2016-08-08 DIAGNOSIS — R1030 Lower abdominal pain, unspecified: Secondary | ICD-10-CM | POA: Insufficient documentation

## 2016-08-08 DIAGNOSIS — B9689 Other specified bacterial agents as the cause of diseases classified elsewhere: Secondary | ICD-10-CM

## 2016-08-08 DIAGNOSIS — Z3A09 9 weeks gestation of pregnancy: Secondary | ICD-10-CM | POA: Insufficient documentation

## 2016-08-08 DIAGNOSIS — O23591 Infection of other part of genital tract in pregnancy, first trimester: Secondary | ICD-10-CM | POA: Insufficient documentation

## 2016-08-08 DIAGNOSIS — N760 Acute vaginitis: Secondary | ICD-10-CM

## 2016-08-08 DIAGNOSIS — O219 Vomiting of pregnancy, unspecified: Secondary | ICD-10-CM

## 2016-08-08 DIAGNOSIS — O210 Mild hyperemesis gravidarum: Secondary | ICD-10-CM | POA: Diagnosis present

## 2016-08-08 HISTORY — DX: Unspecified ovarian cyst, unspecified side: N83.209

## 2016-08-08 LAB — URINALYSIS, ROUTINE W REFLEX MICROSCOPIC
BILIRUBIN URINE: NEGATIVE
GLUCOSE, UA: NEGATIVE mg/dL
HGB URINE DIPSTICK: NEGATIVE
Ketones, ur: NEGATIVE mg/dL
NITRITE: NEGATIVE
PH: 7 (ref 5.0–8.0)
Protein, ur: NEGATIVE mg/dL
Specific Gravity, Urine: 1.02 (ref 1.005–1.030)

## 2016-08-08 LAB — CBC
HEMATOCRIT: 36.5 % (ref 36.0–46.0)
Hemoglobin: 12.7 g/dL (ref 12.0–15.0)
MCH: 32 pg (ref 26.0–34.0)
MCHC: 34.8 g/dL (ref 30.0–36.0)
MCV: 91.9 fL (ref 78.0–100.0)
Platelets: 207 10*3/uL (ref 150–400)
RBC: 3.97 MIL/uL (ref 3.87–5.11)
RDW: 12.7 % (ref 11.5–15.5)
WBC: 9.8 10*3/uL (ref 4.0–10.5)

## 2016-08-08 LAB — WET PREP, GENITAL
Sperm: NONE SEEN
TRICH WET PREP: NONE SEEN
Yeast Wet Prep HPF POC: NONE SEEN

## 2016-08-08 MED ORDER — METRONIDAZOLE 0.75 % VA GEL: 1.0000 | Freq: Two times a day (BID) | VAGINAL | 0 refills | Status: DC

## 2016-08-08 MED ORDER — PROMETHAZINE HCL 25 MG/ML IJ SOLN
25.0000 mg | Freq: Once | INTRAMUSCULAR | Status: AC
  Administered 2016-08-08: 25 mg via INTRAMUSCULAR
  Filled 2016-08-08: qty 1

## 2016-08-08 NOTE — MAU Note (Addendum)
Vomiting for 3 wks. Abd pain for 2 wks. Spotting when I urinate that is light pink. Was seen ED 4 days ago and was told had blood in urine. Given Phenergan but is not working

## 2016-08-08 NOTE — Discharge Instructions (Signed)
Safe Medications in Pregnancy  ° °Acne: °Benzoyl Peroxide °Salicylic Acid ° °Backache/Headache: °Tylenol: 2 regular strength every 4 hours OR °             2 Extra strength every 6 hours ° °Colds/Coughs/Allergies: °Benadryl (alcohol free) 25 mg every 6 hours as needed °Breath right strips °Claritin °Cepacol throat lozenges °Chloraseptic throat spray °Cold-Eeze- up to three times per day °Cough drops, alcohol free °Flonase (by prescription only) °Guaifenesin °Mucinex °Robitussin DM (plain only, alcohol free) °Saline nasal spray/drops °Sudafed (pseudoephedrine) & Actifed ** use only after [redacted] weeks gestation and if you do not have high blood pressure °Tylenol °Vicks Vaporub °Zinc lozenges °Zyrtec  ° °Constipation: °Colace °Ducolax suppositories °Fleet enema °Glycerin suppositories °Metamucil °Milk of magnesia °Miralax °Senokot °Smooth move tea ° °Diarrhea: °Kaopectate °Imodium A-D ° °*NO pepto Bismol ° °Hemorrhoids: °Anusol °Anusol HC °Preparation H °Tucks ° °Indigestion: °Tums °Maalox °Mylanta °Zantac  °Pepcid ° °Insomnia: °Benadryl (alcohol free) 25mg every 6 hours as needed °Tylenol PM °Unisom, no Gelcaps ° °Leg Cramps: °Tums °MagGel ° °Nausea/Vomiting:  °Bonine °Dramamine °Emetrol °Ginger extract °Sea bands °Meclizine  °Nausea medication to take during pregnancy:  °Unisom (doxylamine succinate 25 mg tablets) Take one tablet daily at bedtime. If symptoms are not adequately controlled, the dose can be increased to a maximum recommended dose of two tablets daily (1/2 tablet in the morning, 1/2 tablet mid-afternoon and one at bedtime). °Vitamin B6 100mg tablets. Take one tablet twice a day (up to 200 mg per day). ° °Skin Rashes: °Aveeno products °Benadryl cream or 25mg every 6 hours as needed °Calamine Lotion °1% cortisone cream ° °Yeast infection: °Gyne-lotrimin 7 °Monistat 7 ° °Gum/tooth pain: °Anbesol ° °**If taking multiple medications, please check labels to avoid duplicating the same active ingredients °**take  medication as directed on the label °** Do not exceed 4000 mg of tylenol in 24 hours °**Do not take medications that contain aspirin or ibuprofen ° ° ° ° ° °Bacterial Vaginosis °Bacterial vaginosis is a vaginal infection that occurs when the normal balance of bacteria in the vagina is disrupted. It results from an overgrowth of certain bacteria. This is the most common vaginal infection among women ages 15-44. °Because bacterial vaginosis increases your risk for STIs (sexually transmitted infections), getting treated can help reduce your risk for chlamydia, gonorrhea, herpes, and HIV (human immunodeficiency virus). Treatment is also important for preventing complications in pregnant women, because this condition can cause an early (premature) delivery. °What are the causes? °This condition is caused by an increase in harmful bacteria that are normally present in small amounts in the vagina. However, the reason that the condition develops is not fully understood. °What increases the risk? °The following factors may make you more likely to develop this condition: °· Having a new sexual partner or multiple sexual partners. °· Having unprotected sex. °· Douching. °· Having an intrauterine device (IUD). °· Smoking. °· Drug and alcohol abuse. °· Taking certain antibiotic medicines. °· Being pregnant. ° °You cannot get bacterial vaginosis from toilet seats, bedding, swimming pools, or contact with objects around you. °What are the signs or symptoms? °Symptoms of this condition include: °· Grey or white vaginal discharge. The discharge can also be watery or foamy. °· A fish-like odor with discharge, especially after sexual intercourse or during menstruation. °· Itching in and around the vagina. °· Burning or pain with urination. ° °Some women with bacterial vaginosis have no signs or symptoms. °How is this diagnosed? °This condition is diagnosed based on: °·   Your medical history. °· A physical exam of the vagina. °· Testing  a sample of vaginal fluid under a microscope to look for a large amount of bad bacteria or abnormal cells. Your health care provider may use a cotton swab or a small wooden spatula to collect the sample. ° °How is this treated? °This condition is treated with antibiotics. These may be given as a pill, a vaginal cream, or a medicine that is put into the vagina (suppository). If the condition comes back after treatment, a second round of antibiotics may be needed. °Follow these instructions at home: °Medicines °· Take over-the-counter and prescription medicines only as told by your health care provider. °· Take or use your antibiotic as told by your health care provider. Do not stop taking or using the antibiotic even if you start to feel better. °General instructions °· If you have a female sexual partner, tell her that you have a vaginal infection. She should see her health care provider and be treated if she has symptoms. If you have a female sexual partner, he does not need treatment. °· During treatment: °? Avoid sexual activity until you finish treatment. °? Do not douche. °? Avoid alcohol as directed by your health care provider. °? Avoid breastfeeding as directed by your health care provider. °· Drink enough water and fluids to keep your urine clear or pale yellow. °· Keep the area around your vagina and rectum clean. °? Wash the area daily with warm water. °? Wipe yourself from front to back after using the toilet. °· Keep all follow-up visits as told by your health care provider. This is important. °How is this prevented? °· Do not douche. °· Wash the outside of your vagina with warm water only. °· Use protection when having sex. This includes latex condoms and dental dams. °· Limit how many sexual partners you have. To help prevent bacterial vaginosis, it is best to have sex with just one partner (monogamous). °· Make sure you and your sexual partner are tested for STIs. °· Wear cotton or cotton-lined  underwear. °· Avoid wearing tight pants and pantyhose, especially during summer. °· Limit the amount of alcohol that you drink. °· Do not use any products that contain nicotine or tobacco, such as cigarettes and e-cigarettes. If you need help quitting, ask your health care provider. °· Do not use illegal drugs. °Where to find more information: °· Centers for Disease Control and Prevention: www.cdc.gov/std °· American Sexual Health Association (ASHA): www.ashastd.org °· U.S. Department of Health and Human Services, Office on Women's Health: www.womenshealth.gov/ or https://www.womenshealth.gov/a-z-topics/bacterial-vaginosis °Contact a health care provider if: °· Your symptoms do not improve, even after treatment. °· You have more discharge or pain when urinating. °· You have a fever. °· You have pain in your abdomen. °· You have pain during sex. °· You have vaginal bleeding between periods. °Summary °· Bacterial vaginosis is a vaginal infection that occurs when the normal balance of bacteria in the vagina is disrupted. °· Because bacterial vaginosis increases your risk for STIs (sexually transmitted infections), getting treated can help reduce your risk for chlamydia, gonorrhea, herpes, and HIV (human immunodeficiency virus). Treatment is also important for preventing complications in pregnant women, because the condition can cause an early (premature) delivery. °· This condition is treated with antibiotic medicines. These may be given as a pill, a vaginal cream, or a medicine that is put into the vagina (suppository). °This information is not intended to replace advice given to you by   your health care provider. Make sure you discuss any questions you have with your health care provider. °Document Released: 06/23/2005 Document Revised: 03/08/2016 Document Reviewed: 03/08/2016 °Elsevier Interactive Patient Education © 2017 [employer] ° °

## 2016-08-08 NOTE — MAU Provider Note (Signed)
History     CSN: [id number]  Arrival date and time: 08/08/16 1753   First Provider Initiated Contact with Patient 08/08/16 2115      Chief Complaint  Patient presents with  . Abdominal Pain  . Morning Sickness   HPI  Shelley Bass is a 31 y.o. [id number] at 148w0d who presents with n/v & abdominal pain. Reports nausea & vomiting x 2 weeks. Vomited twice this morning. Has been taking promethazine with minimal relief. Last took meds yesterday. Ate chipotle salad this afternoon and has not vomited but does endorse nausea since then.  Lower abdominal cramping x 2 weeks. Pain is intermittent and cramp like. Rates pain 10/10. Has not treated. Denies heartburn, fever/chills, diarrhea, constipation, dysuria. Some pink spotting on toilet paper intermittently since Tuesday. Some urinary urgency since Tuesday; no dysuria, hematuria, or flank pain. No recent intercourse.   OB History    Gravida Para Term Preterm AB Living   4 2 2  0 1 2   SAB TAB Ectopic Multiple Live Births   0 1 0 0 2      Past Medical History:  Diagnosis Date  . Abortion complicated with hemorrhage   . History of chlamydia   . Ovarian cyst     Past Surgical History:  Procedure Laterality Date  . DILATION AND EVACUATION     post TAB for complications  . INDUCED ABORTION     surgical    Family History  Problem Relation Age of Onset  . Arthritis Mother   . Miscarriages / [place] Mother   . Hypertension Father   . Vision loss Maternal Grandmother     cataracts  . Diabetes Paternal Grandmother   . Anesthesia problems Neg Hx   . Hypotension Neg Hx   . Malignant hyperthermia Neg Hx   . Pseudochol deficiency Neg Hx     Social History  Substance Use Topics  . Smoking status: Never Smoker  . Smokeless tobacco: Never Used  . Alcohol use No    Allergies:  Allergies  Allergen Reactions  . Aspirin Hives    Prescriptions Prior to Admission  Medication Sig Dispense Refill Last Dose  . acetaminophen  (TYLENOL) 325 MG tablet Take 650 mg by mouth every 6 (six) hours as needed for moderate pain.   08/08/2016 at Unknown time  . Prenatal Vit-Fe Fumarate-FA (NAT-RUL PRENATAL VITAMINS) 28-0.8 MG TABS Take 1 tablet by mouth daily. 30 tablet 3 08/08/2016 at Unknown time  . promethazine (PHENERGAN) 25 MG tablet Take 1 tablet (25 mg total) by mouth every 6 (six) hours as needed for nausea or vomiting. 12 tablet 2 08/07/2016 at Unknown time    Review of Systems  Constitutional: Positive for fatigue. Negative for chills and fever.  Gastrointestinal: Positive for abdominal pain, nausea and vomiting. Negative for abdominal distention, constipation and diarrhea.  Genitourinary: Positive for urgency and vaginal bleeding. Negative for dysuria, flank pain, frequency, hematuria and vaginal discharge.   Physical Exam   Blood pressure 115/63, pulse 80, temperature 98.5 F (36.9 C), resp. rate 18, height 5\' 4"  (1.626 m), weight 157 lb (71.2 kg), last menstrual period 06/06/2016, unknown if currently breastfeeding.  Physical Exam  Nursing note and vitals reviewed. Constitutional: She is oriented to person, place, and time. She appears well-developed and well-nourished. No distress.  HENT:  Head: Normocephalic and atraumatic.  Eyes: Conjunctivae are normal. Right eye exhibits no discharge. Left eye exhibits no discharge. No scleral icterus.  Neck: Normal range of motion.  Cardiovascular: Normal  rate, regular rhythm and normal heart sounds.   No murmur heard. Respiratory: Effort normal and breath sounds normal. No respiratory distress. She has no wheezes.  GI: Soft. Bowel sounds are normal. She exhibits no distension. There is no tenderness.  Genitourinary: No bleeding in the vagina. Vaginal discharge (small amount of thin tan discharge) found.  Genitourinary Comments: Cervix closed  Neurological: She is alert and oriented to person, place, and time.  Skin: Skin is warm and dry. She is not diaphoretic.   Psychiatric: She has a normal mood and affect. Her behavior is normal. Judgment and thought content normal.    MAU Course  Procedures Results for orders placed or performed during the hospital encounter of 08/08/16 (from the past 24 hour(s))  Urinalysis, Routine w reflex microscopic     Status: Abnormal   Collection Time: 08/08/16  7:35 PM  Result Value Ref Range   Color, Urine YELLOW YELLOW   APPearance HAZY (A) CLEAR   Specific Gravity, Urine 1.020 1.005 - 1.030   pH 7.0 5.0 - 8.0   Glucose, UA NEGATIVE NEGATIVE mg/dL   Hgb urine dipstick NEGATIVE NEGATIVE   Bilirubin Urine NEGATIVE NEGATIVE   Ketones, ur NEGATIVE NEGATIVE mg/dL   Protein, ur NEGATIVE NEGATIVE mg/dL   Nitrite NEGATIVE NEGATIVE   Leukocytes, UA TRACE (A) NEGATIVE   RBC / HPF 0-5 0 - 5 RBC/hpf   WBC, UA 0-5 0 - 5 WBC/hpf   Bacteria, UA RARE (A) NONE SEEN   Squamous Epithelial / LPF 6-30 (A) NONE SEEN   Mucous PRESENT   CBC     Status: None   Collection Time: 08/08/16  9:35 PM  Result Value Ref Range   WBC 9.8 4.0 - 10.5 K/uL   RBC 3.97 3.87 - 5.11 MIL/uL   Hemoglobin 12.7 12.0 - 15.0 g/dL   HCT 40.9 81.1 - 91.4 %   MCV 91.9 78.0 - 100.0 fL   MCH 32.0 26.0 - 34.0 pg   MCHC 34.8 30.0 - 36.0 g/dL   RDW 78.2 95.6 - 21.3 %   Platelets 207 150 - 400 K/uL  Wet prep, genital     Status: Abnormal   Collection Time: 08/08/16  9:45 PM  Result Value Ref Range   Yeast Wet Prep HPF POC NONE SEEN NONE SEEN   Trich, Wet Prep NONE SEEN NONE SEEN   Clue Cells Wet Prep HPF POC PRESENT (A) NONE SEEN   WBC, Wet Prep HPF POC MODERATE (A) NONE SEEN   Sperm NONE SEEN     MDM A positive IUP on previous ultrasound No blood on exam VSS, NAD Promethazine 25 mg IM Pt not observed vomiting in MAU CBC -- hemoglobin stable GC/CT & wet prep Assessment and Plan  A: 1. Nausea and vomiting during pregnancy prior to [redacted] weeks gestation   2. BV (bacterial vaginosis)    P: Discharge home Continue promethazine as  prescribed Discussed use of unisom & b6 Rx metrogel GC/CT & urine culture pending Discussed reasons to return to MAU Start prenatal care  Judeth Horn 08/08/2016, 9:15 PM

## 2016-08-10 LAB — CULTURE, OB URINE

## 2016-08-11 LAB — GC/CHLAMYDIA PROBE AMP (~~LOC~~) NOT AT ARMC
CHLAMYDIA, DNA PROBE: NEGATIVE
Neisseria Gonorrhea: NEGATIVE

## 2016-10-22 ENCOUNTER — Other Ambulatory Visit: Payer: Self-pay | Admitting: Obstetrics and Gynecology

## 2016-10-22 LAB — OB RESULTS CONSOLE HEPATITIS B SURFACE ANTIGEN: Hepatitis B Surface Ag: NEGATIVE

## 2016-10-22 LAB — OB RESULTS CONSOLE RUBELLA ANTIBODY, IGM: RUBELLA: IMMUNE

## 2016-10-22 LAB — OB RESULTS CONSOLE ABO/RH: RH Type: POSITIVE

## 2016-10-22 LAB — HM PAP SMEAR: HM Pap smear: NEGATIVE

## 2016-10-22 LAB — RESULTS CONSOLE HPV: CHL HPV: NEGATIVE

## 2016-10-22 LAB — OB RESULTS CONSOLE HIV ANTIBODY (ROUTINE TESTING): HIV: NONREACTIVE

## 2016-10-22 LAB — OB RESULTS CONSOLE ANTIBODY SCREEN: ANTIBODY SCREEN: NEGATIVE

## 2016-10-22 LAB — OB RESULTS CONSOLE GC/CHLAMYDIA
CHLAMYDIA, DNA PROBE: NEGATIVE
GC PROBE AMP, GENITAL: NEGATIVE

## 2016-10-22 LAB — OB RESULTS CONSOLE RPR: RPR: NONREACTIVE

## 2016-10-24 LAB — CYTOLOGY - PAP

## 2017-01-14 ENCOUNTER — Encounter: Payer: Medicaid Other | Attending: Obstetrics and Gynecology | Admitting: Registered"

## 2017-01-14 DIAGNOSIS — Z3A00 Weeks of gestation of pregnancy not specified: Secondary | ICD-10-CM | POA: Diagnosis not present

## 2017-01-14 DIAGNOSIS — R7309 Other abnormal glucose: Secondary | ICD-10-CM

## 2017-01-14 DIAGNOSIS — O24419 Gestational diabetes mellitus in pregnancy, unspecified control: Secondary | ICD-10-CM | POA: Diagnosis not present

## 2017-01-14 DIAGNOSIS — Z713 Dietary counseling and surveillance: Secondary | ICD-10-CM | POA: Insufficient documentation

## 2017-01-15 ENCOUNTER — Encounter: Payer: Self-pay | Admitting: Registered"

## 2017-01-15 NOTE — Progress Notes (Signed)
Patient was seen on 01/14/18 for Gestational Diabetes self-management class at the [facility]. The following learning objectives were met by the patient during this course:   States the definition of Gestational Diabetes  States why dietary management is important in controlling blood glucose  Describes the effects each nutrient has on blood glucose levels  Demonstrates ability to create a balanced meal plan  Demonstrates carbohydrate counting   States when to check blood glucose levels  Demonstrates proper blood glucose monitoring techniques  States the effect of stress and exercise on blood glucose levels  States the importance of limiting caffeine and abstaining from alcohol and smoking  Blood glucose monitor given: Accu Chek Guide Lot # [id number] Exp: 08/22/17 Blood glucose reading: not recorded  Patient instructed to monitor glucose levels: FBS: 60 - <95 1 hour: <140 2 hour: <120  Patient received handouts:  Nutrition Diabetes and Pregnancy  Carbohydrate Counting List  Patient will be seen for follow-up as needed.

## 2017-01-28 ENCOUNTER — Inpatient Hospital Stay (HOSPITAL_COMMUNITY): Payer: Medicaid Other

## 2017-01-28 ENCOUNTER — Encounter (HOSPITAL_COMMUNITY): Payer: Self-pay | Admitting: *Deleted

## 2017-01-28 ENCOUNTER — Inpatient Hospital Stay (HOSPITAL_COMMUNITY)
Admission: AD | Admit: 2017-01-28 | Discharge: 2017-01-28 | Disposition: A | Discharge status: Home / Self Care | Payer: Medicaid Other | Source: Ambulatory Visit | Attending: Obstetrics & Gynecology | Admitting: Obstetrics & Gynecology

## 2017-01-28 DIAGNOSIS — M545 Low back pain: Secondary | ICD-10-CM | POA: Diagnosis not present

## 2017-01-28 DIAGNOSIS — Y92414 Local residential or business street as the place of occurrence of the external cause: Secondary | ICD-10-CM | POA: Diagnosis not present

## 2017-01-28 DIAGNOSIS — Z3A33 33 weeks gestation of pregnancy: Secondary | ICD-10-CM | POA: Insufficient documentation

## 2017-01-28 DIAGNOSIS — O9989 Other specified diseases and conditions complicating pregnancy, childbirth and the puerperium: Secondary | ICD-10-CM | POA: Diagnosis not present

## 2017-01-28 DIAGNOSIS — O26893 Other specified pregnancy related conditions, third trimester: Secondary | ICD-10-CM | POA: Insufficient documentation

## 2017-01-28 DIAGNOSIS — O9A213 Injury, poisoning and certain other consequences of external causes complicating pregnancy, third trimester: Secondary | ICD-10-CM | POA: Diagnosis not present

## 2017-01-28 HISTORY — DX: Gestational diabetes mellitus in pregnancy, unspecified control: O24.419

## 2017-01-28 LAB — URINALYSIS, ROUTINE W REFLEX MICROSCOPIC
BILIRUBIN URINE: NEGATIVE
Glucose, UA: NEGATIVE mg/dL
KETONES UR: NEGATIVE mg/dL
LEUKOCYTES UA: NEGATIVE
NITRITE: NEGATIVE
PH: 7 (ref 5.0–8.0)
Protein, ur: NEGATIVE mg/dL
RBC / HPF: NONE SEEN RBC/hpf (ref 0–5)
Specific Gravity, Urine: 1.003 — ABNORMAL LOW (ref 1.005–1.030)

## 2017-01-28 LAB — GLUCOSE, CAPILLARY: GLUCOSE-CAPILLARY: 101 mg/dL — AB (ref 65–99)

## 2017-01-28 MED ORDER — ACETAMINOPHEN 500 MG PO TABS
1000.0000 mg | ORAL_TABLET | Freq: Once | ORAL | Status: AC
  Administered 2017-01-28: 1000 mg via ORAL
  Filled 2017-01-28: qty 2
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
## 2017-01-28 NOTE — MAU Note (Signed)
Urine in lab 

## 2017-02-11 LAB — OB RESULTS CONSOLE GBS: STREP GROUP B AG: NEGATIVE

## 2017-03-05 ENCOUNTER — Inpatient Hospital Stay (HOSPITAL_COMMUNITY): Payer: Medicaid Other | Admitting: Anesthesiology

## 2017-03-05 ENCOUNTER — Encounter (HOSPITAL_COMMUNITY): Payer: Self-pay

## 2017-03-05 ENCOUNTER — Inpatient Hospital Stay (HOSPITAL_COMMUNITY)
Admission: AD | Admit: 2017-03-05 | Discharge: 2017-03-07 | DRG: 775 | Disposition: A | Discharge status: Home / Self Care | Payer: Medicaid Other | Source: Ambulatory Visit | Attending: Obstetrics and Gynecology | Admitting: Obstetrics and Gynecology

## 2017-03-05 DIAGNOSIS — O24420 Gestational diabetes mellitus in childbirth, diet controlled: Secondary | ICD-10-CM | POA: Diagnosis present

## 2017-03-05 DIAGNOSIS — O9902 Anemia complicating childbirth: Secondary | ICD-10-CM | POA: Diagnosis present

## 2017-03-05 DIAGNOSIS — D649 Anemia, unspecified: Secondary | ICD-10-CM | POA: Diagnosis present

## 2017-03-05 DIAGNOSIS — Z3490 Encounter for supervision of normal pregnancy, unspecified, unspecified trimester: Secondary | ICD-10-CM

## 2017-03-05 DIAGNOSIS — Z3A38 38 weeks gestation of pregnancy: Secondary | ICD-10-CM

## 2017-03-05 LAB — CBC
HCT: 31.4 % — ABNORMAL LOW (ref 36.0–46.0)
HEMATOCRIT: 28.5 % — AB (ref 36.0–46.0)
HEMOGLOBIN: 10.4 g/dL — AB (ref 12.0–15.0)
HEMOGLOBIN: 9.4 g/dL — AB (ref 12.0–15.0)
MCH: 28.6 pg (ref 26.0–34.0)
MCH: 28.1 pg (ref 26.0–34.0)
MCHC: 33.1 g/dL (ref 30.0–36.0)
MCHC: 33 g/dL (ref 30.0–36.0)
MCV: 86.3 fL (ref 78.0–100.0)
MCV: 85.1 fL (ref 78.0–100.0)
PLATELETS: 135 10*3/uL — AB (ref 150–400)
Platelets: 132 10*3/uL — ABNORMAL LOW (ref 150–400)
RBC: 3.64 MIL/uL — AB (ref 3.87–5.11)
RBC: 3.35 MIL/uL — AB (ref 3.87–5.11)
RDW: 16.8 % — ABNORMAL HIGH (ref 11.5–15.5)
RDW: 17 % — ABNORMAL HIGH (ref 11.5–15.5)
WBC: 4.7 10*3/uL (ref 4.0–10.5)
WBC: 7.3 10*3/uL (ref 4.0–10.5)

## 2017-03-05 LAB — TYPE AND SCREEN
ABO/RH(D): A POS
Antibody Screen: NEGATIVE

## 2017-03-05 LAB — GLUCOSE, CAPILLARY
GLUCOSE-CAPILLARY: 126 mg/dL — AB (ref 65–99)
Glucose-Capillary: 77 mg/dL (ref 65–99)

## 2017-03-05 MED ORDER — EPHEDRINE 5 MG/ML INJ
10.0000 mg | INTRAVENOUS | Status: DC | PRN
Start: 2017-03-05 — End: 2017-03-05
  Filled 2017-03-05: qty 2

## 2017-03-05 MED ORDER — FENTANYL 2.5 MCG/ML BUPIVACAINE 1/10 % EPIDURAL INFUSION (WH - ANES)
14.0000 mL/h | INTRAMUSCULAR | Status: DC | PRN
  Administered 2017-03-05: 14 mL/h via EPIDURAL
  Filled 2017-03-05: qty 100

## 2017-03-05 MED ORDER — ONDANSETRON HCL 4 MG/2ML IJ SOLN: 4.0000 mg | INTRAMUSCULAR | Status: DC | PRN

## 2017-03-05 MED ORDER — OXYTOCIN 40 UNITS IN LACTATED RINGERS INFUSION - SIMPLE MED
2.5000 [IU]/h | INTRAVENOUS | Status: DC
  Filled 2017-03-05: qty 1000

## 2017-03-05 MED ORDER — WITCH HAZEL-GLYCERIN EX PADS
1.0000 "application " | MEDICATED_PAD | CUTANEOUS | Status: DC | PRN
  Administered 2017-03-06: 1 via TOPICAL

## 2017-03-05 MED ORDER — IBUPROFEN 800 MG PO TABS
800.0000 mg | ORAL_TABLET | Freq: Three times a day (TID) | ORAL | Status: DC
  Administered 2017-03-05 – 2017-03-06 (×4): 800 mg via ORAL
  Filled 2017-03-05 (×4): qty 1

## 2017-03-05 MED ORDER — OXYTOCIN 40 UNITS IN LACTATED RINGERS INFUSION - SIMPLE MED
1.0000 m[IU]/min | INTRAVENOUS | Status: DC
  Administered 2017-03-05: 2 m[IU]/min via INTRAVENOUS

## 2017-03-05 MED ORDER — SENNOSIDES-DOCUSATE SODIUM 8.6-50 MG PO TABS
2.0000 | ORAL_TABLET | ORAL | Status: DC
  Administered 2017-03-05 – 2017-03-06 (×2): 2 via ORAL
  Filled 2017-03-05 (×2): qty 2

## 2017-03-05 MED ORDER — OXYCODONE-ACETAMINOPHEN 5-325 MG PO TABS: 1.0000 | ORAL_TABLET | ORAL | Status: DC | PRN

## 2017-03-05 MED ORDER — LIDOCAINE HCL (PF) 1 % IJ SOLN
INTRAMUSCULAR | Status: DC | PRN
  Administered 2017-03-05 (×2): 5 mL

## 2017-03-05 MED ORDER — ONDANSETRON HCL 4 MG PO TABS: 4.0000 mg | ORAL_TABLET | ORAL | Status: DC | PRN

## 2017-03-05 MED ORDER — METHYLERGONOVINE MALEATE 0.2 MG/ML IJ SOLN: 0.2000 mg | INTRAMUSCULAR | Status: DC | PRN

## 2017-03-05 MED ORDER — ONDANSETRON HCL 4 MG/2ML IJ SOLN: 4.0000 mg | Freq: Four times a day (QID) | INTRAMUSCULAR | Status: DC | PRN

## 2017-03-05 MED ORDER — LACTATED RINGERS IV SOLN
INTRAVENOUS | Status: DC
  Administered 2017-03-05: 11:00:00 via INTRAVENOUS

## 2017-03-05 MED ORDER — OXYTOCIN BOLUS FROM INFUSION
500.0000 mL | Freq: Once | INTRAVENOUS | Status: AC
  Administered 2017-03-05: 500 mL via INTRAVENOUS

## 2017-03-05 MED ORDER — TERBUTALINE SULFATE 1 MG/ML IJ SOLN
0.2500 mg | Freq: Once | INTRAMUSCULAR | Status: DC | PRN
  Filled 2017-03-05: qty 1

## 2017-03-05 MED ORDER — SODIUM CHLORIDE 0.9% FLUSH: 3.0000 mL | INTRAVENOUS | Status: DC | PRN

## 2017-03-05 MED ORDER — ACETAMINOPHEN 325 MG PO TABS: 650.0000 mg | ORAL_TABLET | ORAL | Status: DC | PRN

## 2017-03-05 MED ORDER — SOD CITRATE-CITRIC ACID 500-334 MG/5ML PO SOLN: 30.0000 mL | ORAL | Status: DC | PRN

## 2017-03-05 MED ORDER — PHENYLEPHRINE 40 MCG/ML (10ML) SYRINGE FOR IV PUSH (FOR BLOOD PRESSURE SUPPORT)
80.0000 ug | PREFILLED_SYRINGE | INTRAVENOUS | Status: DC | PRN
  Filled 2017-03-05: qty 5

## 2017-03-05 MED ORDER — ZOLPIDEM TARTRATE 5 MG PO TABS: 5.0000 mg | ORAL_TABLET | Freq: Every evening | ORAL | Status: DC | PRN

## 2017-03-05 MED ORDER — PRENATAL MULTIVITAMIN CH
1.0000 | ORAL_TABLET | Freq: Every day | ORAL | Status: DC
  Administered 2017-03-06: 1 via ORAL
  Filled 2017-03-05: qty 1

## 2017-03-05 MED ORDER — LACTATED RINGERS IV SOLN: 500.0000 mL | INTRAVENOUS | Status: DC | PRN

## 2017-03-05 MED ORDER — METHYLERGONOVINE MALEATE 0.2 MG/ML IJ SOLN
INTRAMUSCULAR | Status: AC
  Filled 2017-03-05: qty 1

## 2017-03-05 MED ORDER — FLEET ENEMA 7-19 GM/118ML RE ENEM: 1.0000 | ENEMA | RECTAL | Status: DC | PRN

## 2017-03-05 MED ORDER — FERROUS SULFATE 325 (65 FE) MG PO TABS
325.0000 mg | ORAL_TABLET | Freq: Two times a day (BID) | ORAL | Status: DC
  Administered 2017-03-06 (×2): 325 mg via ORAL
  Filled 2017-03-05 (×2): qty 1

## 2017-03-05 MED ORDER — SODIUM CHLORIDE 0.9% FLUSH: 3.0000 mL | Freq: Two times a day (BID) | INTRAVENOUS | Status: DC

## 2017-03-05 MED ORDER — MEASLES, MUMPS & RUBELLA VAC ~~LOC~~ INJ
0.5000 mL | INJECTION | Freq: Once | SUBCUTANEOUS | Status: DC
  Filled 2017-03-05: qty 0.5

## 2017-03-05 MED ORDER — MAGNESIUM HYDROXIDE 400 MG/5ML PO SUSP: 30.0000 mL | ORAL | Status: DC | PRN

## 2017-03-05 MED ORDER — DIPHENHYDRAMINE HCL 25 MG PO CAPS
25.0000 mg | ORAL_CAPSULE | Freq: Four times a day (QID) | ORAL | Status: DC | PRN
  Administered 2017-03-07: 25 mg via ORAL
  Filled 2017-03-05: qty 1

## 2017-03-05 MED ORDER — COCONUT OIL OIL
1.0000 "application " | TOPICAL_OIL | Status: DC | PRN
  Administered 2017-03-05: 1 via TOPICAL
  Filled 2017-03-05: qty 120

## 2017-03-05 MED ORDER — OXYCODONE-ACETAMINOPHEN 5-325 MG PO TABS: 2.0000 | ORAL_TABLET | ORAL | Status: DC | PRN

## 2017-03-05 MED ORDER — SIMETHICONE 80 MG PO CHEW: 80.0000 mg | CHEWABLE_TABLET | ORAL | Status: DC | PRN

## 2017-03-05 MED ORDER — BUTORPHANOL TARTRATE 1 MG/ML IJ SOLN: 1.0000 mg | INTRAMUSCULAR | Status: DC | PRN

## 2017-03-05 MED ORDER — DIBUCAINE 1 % RE OINT: 1.0000 "application " | TOPICAL_OINTMENT | RECTAL | Status: DC | PRN

## 2017-03-05 MED ORDER — ACETAMINOPHEN 325 MG PO TABS
650.0000 mg | ORAL_TABLET | ORAL | Status: DC | PRN
  Administered 2017-03-05 – 2017-03-07 (×6): 650 mg via ORAL
  Filled 2017-03-05 (×6): qty 2

## 2017-03-05 MED ORDER — METHYLERGONOVINE MALEATE 0.2 MG PO TABS: 0.2000 mg | ORAL_TABLET | ORAL | Status: DC | PRN

## 2017-03-05 MED ORDER — LIDOCAINE HCL (PF) 1 % IJ SOLN
30.0000 mL | INTRAMUSCULAR | Status: DC | PRN
  Filled 2017-03-05: qty 30

## 2017-03-05 MED ORDER — METHYLERGONOVINE MALEATE 0.2 MG/ML IJ SOLN
0.2000 mg | Freq: Once | INTRAMUSCULAR | Status: AC
  Administered 2017-03-05: 0.2 mg via INTRAMUSCULAR

## 2017-03-05 MED ORDER — BENZOCAINE-MENTHOL 20-0.5 % EX AERO
1.0000 "application " | INHALATION_SPRAY | CUTANEOUS | Status: DC | PRN
  Administered 2017-03-06: 1 via TOPICAL
  Filled 2017-03-05: qty 56

## 2017-03-05 MED ORDER — EPHEDRINE 5 MG/ML INJ
10.0000 mg | INTRAVENOUS | Status: DC | PRN
  Filled 2017-03-05: qty 2

## 2017-03-05 MED ORDER — SODIUM CHLORIDE 0.9 % IV SOLN: 250.0000 mL | INTRAVENOUS | Status: DC | PRN

## 2017-03-05 MED ORDER — DIPHENHYDRAMINE HCL 50 MG/ML IJ SOLN: 12.5000 mg | INTRAMUSCULAR | Status: DC | PRN

## 2017-03-05 MED ORDER — LACTATED RINGERS IV SOLN
500.0000 mL | Freq: Once | INTRAVENOUS | Status: AC
  Administered 2017-03-05: 500 mL via INTRAVENOUS

## 2017-03-05 MED ORDER — PHENYLEPHRINE 40 MCG/ML (10ML) SYRINGE FOR IV PUSH (FOR BLOOD PRESSURE SUPPORT)
80.0000 ug | PREFILLED_SYRINGE | INTRAVENOUS | Status: DC | PRN
  Filled 2017-03-05: qty 5
  Filled 2017-03-05: qty 10

## 2017-03-05 MED ORDER — TETANUS-DIPHTH-ACELL PERTUSSIS 5-2.5-18.5 LF-MCG/0.5 IM SUSP: 0.5000 mL | Freq: Once | INTRAMUSCULAR | Status: DC

## 2017-03-05 NOTE — Anesthesia Preprocedure Evaluation (Signed)
Anesthesia Evaluation  Patient identified by MRN, date of birth, ID band Patient awake    Reviewed: Allergy & Precautions, H&P , NPO status , Patient's Chart, lab work & pertinent test results  History of Anesthesia Complications Negative for: history of anesthetic complications  Airway Mallampati: II  TM Distance: >3 FB Neck ROM: full    Dental no notable dental hx. (+) Teeth Intact   Pulmonary neg pulmonary ROS,    Pulmonary exam normal breath sounds clear to auscultation       Cardiovascular negative cardio ROS Normal cardiovascular exam Rhythm:regular Rate:Normal     Neuro/Psych negative neurological ROS  negative psych ROS   GI/Hepatic negative GI ROS, Neg liver ROS,   Endo/Other  diabetes, Gestational  Renal/GU negative Renal ROS  negative genitourinary   Musculoskeletal   Abdominal   Peds  Hematology negative hematology ROS (+)   Anesthesia Other Findings   Reproductive/Obstetrics (+) Pregnancy                             Anesthesia Physical Anesthesia Plan  ASA: II  Anesthesia Plan: Epidural   Post-op Pain Management:    Induction:   PONV Risk Score and Plan:   Airway Management Planned:   Additional Equipment:   Intra-op Plan:   Post-operative Plan:   Informed Consent: I have reviewed the patients History and Physical, chart, labs and discussed the procedure including the risks, benefits and alternatives for the proposed anesthesia with the patient or authorized representative who has indicated his/her understanding and acceptance.       Plan Discussed with:   Anesthesia Plan Comments:         Anesthesia Quick Evaluation  

## 2017-03-05 NOTE — Anesthesia Procedure Notes (Signed)
Epidural Patient location during procedure: OB  Staffing Anesthesiologist: Jotham Ahn Performed: anesthesiologist   Preanesthetic Checklist Completed: patient identified, site marked, surgical consent, pre-op evaluation, timeout performed, IV checked, risks and benefits discussed and monitors and equipment checked  Epidural Patient position: sitting Prep: DuraPrep Patient monitoring: heart rate, continuous pulse ox and blood pressure Approach: right paramedian Location: L3-L4 Injection technique: LOR saline  Needle:  Needle type: Tuohy  Needle gauge: 17 G Needle length: 9 cm and 9 Needle insertion depth: 6 cm Catheter type: closed end flexible Catheter size: 20 Guage Catheter at skin depth: 10 cm Test dose: negative  Assessment Events: blood not aspirated, injection not painful, no injection resistance, negative IV test and no paresthesia  Additional Notes Patient identified. Risks/Benefits/Options discussed with patient including but not limited to bleeding, infection, nerve damage, paralysis, failed block, incomplete pain control, headache, blood pressure changes, nausea, vomiting, reactions to medication both or allergic, itching and postpartum back pain. Confirmed with bedside nurse the patient's most recent platelet count. Confirmed with patient that they are not currently taking any anticoagulation, have any bleeding history or any family history of bleeding disorders. Patient expressed understanding and wished to proceed. All questions were answered. Sterile technique was used throughout the entire procedure. Please see nursing notes for vital signs. Test dose was given through epidural needle and negative prior to continuing to dose epidural or start infusion. Warning signs of high block given to the patient including shortness of breath, tingling/numbness in hands, complete motor block, or any concerning symptoms with instructions to call for help. Patient was given  instructions on fall risk and not to get out of bed. All questions and concerns addressed with instructions to call with any issues.     

## 2017-03-05 NOTE — H&P (Signed)
31 y.o. 3433w6d  [id number] comes in for induction at term with A1GDM- pt for induction since sugars on own starting to rise but not excessive.  Today FBS 83.  Otherwise has good fetal movement and no bleeding.  Past Medical History:  Diagnosis Date  . Abortion complicated with hemorrhage   . Gestational diabetes   . History of chlamydia   . Ovarian cyst     Past Surgical History:  Procedure Laterality Date  . DILATION AND EVACUATION     post TAB for complications  . INDUCED ABORTION     surgical    OB History  Gravida Para Term Preterm AB Living  4 2 2  0 1 2  SAB TAB Ectopic Multiple Live Births  0 1 0 0 2    # Outcome Date GA Lbr Len/2nd Weight Sex Delivery Anes PTL Lv  4 Current           3 Term 06/19/11 8467w0d 12:34 / 00:18 6 lb 13.3 oz (3.099 kg) F Vag-Spont EPI  LIV     Birth Comments: caput  2 Term 2009 4486w0d  5 lb 14 oz (2.665 kg) M Vag-Spont   LIV  1 TAB               Social History   Social History  . Marital status: Single    Spouse name: N/A  . Number of children: N/A  . Years of education: N/A   Occupational History  . Not on file.   Social History Main Topics  . Smoking status: Never Smoker  . Smokeless tobacco: Never Used  . Alcohol use No  . Drug use: No  . Sexual activity: Yes   Other Topics Concern  . Not on file   Social History Narrative  . No narrative on file   Aspirin    Prenatal Transfer Tool  Maternal Diabetes: Yes:  Diabetes Type:  Diet controlled Genetic Screening: Declined Maternal Ultrasounds/Referrals: Normal Fetal Ultrasounds or other Referrals:  None Maternal Substance Abuse:  No Significant Maternal Medications:  None Significant Maternal Lab Results: None  Other PNC: uncomplicated.    Vitals:   03/05/17 1058 03/05/17 1124  BP:  122/65  Pulse:  90  Resp: 18 17  Temp:       Lungs/Cor:  NAD Abdomen:  soft, gravid Ex:  no cords, erythema SVE:  4/70/-2, VTX, AROM clear FHTs:  140s, good STV, NST R Toco:  q  4-5   A/P   Term A1GDM, for induction  GBS neg.  Echo [place] A

## 2017-03-05 NOTE — Anesthesia Postprocedure Evaluation (Signed)
Anesthesia Post Note  Patient: Shelley Bass  Procedure(s) Performed: * No procedures listed *     Patient location during evaluation: Mother Baby Anesthesia Type: Epidural Level of consciousness: awake and alert and oriented Pain management: satisfactory to patient Vital Signs Assessment: post-procedure vital signs reviewed and stable Respiratory status: respiratory function stable Cardiovascular status: stable Postop Assessment: no headache, no backache, epidural receding, patient able to bend at knees, no signs of nausea or vomiting and adequate PO intake Anesthetic complications: no    Last Vitals:  Vitals:   03/05/17 1843 03/05/17 1946  BP: 120/65 129/84  Pulse: 89 (!) 102  Resp: 18 20  Temp: 36.7 C 36.9 C  SpO2:  99%    Last Pain:  Vitals:   03/05/17 1946  TempSrc: Oral  PainSc:    Pain Goal: Patients Stated Pain Goal: 3 (03/05/17 1843)               Karleen DolphinFUSSELL,Juliahna Wiswell

## 2017-03-05 NOTE — Lactation Note (Signed)
This note was copied from a baby's chart. Lactation Consultation Note  Patient Name: Boy Starleen BlueShalinda Dilauro ZOXWR'UToday's Date: 03/05/2017 Reason for consult: Initial assessment Baby at 1 hr of life. Upon entry mom was trying to latch baby in cradle position to the R breast. Showed mom how to use cross cradle and compress the breast. Baby latched easily and deeply. Mom denies breast or nipple pain, voiced no concerns. Discussed baby behavior, feeding frequency, baby belly size, voids, wt loss, breast changes, and nipple care. There was a lot going on in the room when lactation was present, mom may need a repeat of bf education. Demonstrated manual expression, colostrum noted bilaterally, reviewed spoon feeding. Given lactation handouts. Aware of OP services and support group.     Maternal Data Has patient been taught Hand Expression?: Yes (per mom)  Feeding Feeding Type: Breast Fed Length of feed: 10 min (still latched at exit)  [facility] Score Latch: Grasps breast easily, tongue down, lips flanged, rhythmical sucking.  Audible Swallowing: None  Type of Nipple: Everted at rest and after stimulation  Comfort (Breast/Nipple): Soft / non-tender  Hold (Positioning): Full assist, staff holds infant at breast  LATCH Score: 6  Interventions Interventions: Breast feeding basics reviewed;Assisted with latch;Skin to skin;Breast massage;Hand express  Lactation Tools Discussed/Used     Consult Status Consult Status: Follow-up Date: 03/06/17 Follow-up type: In-patient    Rulon Eisenmengerlizabeth E Jodette Wik 03/05/2017, 4:57 PM

## 2017-03-06 LAB — CBC
HCT: 24.1 % — ABNORMAL LOW (ref 36.0–46.0)
Hemoglobin: 8 g/dL — ABNORMAL LOW (ref 12.0–15.0)
MCH: 28.5 pg (ref 26.0–34.0)
MCHC: 33.2 g/dL (ref 30.0–36.0)
MCV: 85.8 fL (ref 78.0–100.0)
PLATELETS: 131 10*3/uL — AB (ref 150–400)
RBC: 2.81 MIL/uL — ABNORMAL LOW (ref 3.87–5.11)
RDW: 17 % — AB (ref 11.5–15.5)
WBC: 7.1 10*3/uL (ref 4.0–10.5)

## 2017-03-06 MED ORDER — FERROUS SULFATE 325 (65 FE) MG PO TABS: 325.0000 mg | ORAL_TABLET | Freq: Two times a day (BID) | ORAL | Status: DC

## 2017-03-06 NOTE — Lactation Note (Signed)
This note was copied from a baby's chart. Lactation Consultation Note Mom has 418 yr old that she BF for 6 months and her 30 yr old she BF for 9 months. Mom supplemented during the day and mainly BF at night after 3 months old.  Mom has everted nipples. Rt. Breast feels heavy, Lt. Breast is much softer. Baby is BF on Lt. Breast. Encouraged mom to assess breast before and after BF for transfer.  Hand expressed w/no colostrum noted. Moms breast tender. States nipples sore d/t baby wanting to BF so much. RN gave coconut oil. Skin to nipples appear to be intact. Tissue appears to look tough.  Mom holding baby in cradle position when entered rm w/body supine, head turned towards mom, small flange and pulling on nipple d/t not close enough. repositioned baby. Educated on body alignment while BF,comfort, support, STS. I&O, cluster feeding, supply and demand. Mom encouraged to feed baby 8-12 times/24 hours and with feeding cues. Encouraged to call for assistance or questions.  WH/LC brochure given w/resources, support groups and LC services. Has WIC.  Patient Name: Shelley Bass NWGNF'AToday's Date: 03/06/2017 Reason for consult: Initial assessment   Maternal Data Has patient been taught Hand Expression?: Yes Does the patient have breastfeeding experience prior to this delivery?: Yes  Feeding Feeding Type: Breast Fed Length of feed: 20 min (still BF)  LATCH Score Latch: Grasps breast easily, tongue down, lips flanged, rhythmical sucking.  Audible Swallowing: A few with stimulation  Type of Nipple: Everted at rest and after stimulation  Comfort (Breast/Nipple): Filling, red/small blisters or bruises, mild/mod discomfort  Hold (Positioning): Assistance needed to correctly position infant at breast and maintain latch.  LATCH Score: 7  Interventions Interventions: Breast feeding basics reviewed;Support pillows;Assisted with latch;Position options;Skin to skin;Breast massage;Coconut oil;Hand  express;Reverse pressure;Breast compression;Adjust position  Lactation Tools Discussed/Used WIC Program: Yes   Consult Status Consult Status: Follow-up Date: 03/07/17 Follow-up type: In-patient    Sherman Lipuma, Diamond NickelLAURA G 03/06/2017, 3:04 AM

## 2017-03-06 NOTE — Plan of Care (Signed)
Problem: Pain Managment: Goal: General experience of comfort will improve Patient states that ibuprofen and acetaminophen works to control her pain levels. Patient declines wanting any narcotics even when her pain levels increase.

## 2017-03-06 NOTE — Lactation Note (Signed)
This note was copied from a baby's chart. Lactation Consultation Note  Patient Name: Shelley Bass WUJWJ'XToday's Date: 03/06/2017 Reason for consult: Follow-up assessment   P3, Ex Bf. Mother hand expressed glistening. Attempted latching but baby is sleepy. Discussed basics.  Mom encouraged to feed baby 8-12 times/24 hours and with feeding cues.  Mother's nipples are tender and has coconut oil.  Discussed deep latch and chin tug. Suggest mother call if she needs assistance.  Maternal Data    Feeding Feeding Type: Breast Fed Length of feed: 10 min  LATCH Score                   Interventions    Lactation Tools Discussed/Used     Consult Status Consult Status: Follow-up Date: 03/07/17 Follow-up type: In-patient    Dahlia ByesBerkelhammer, Rik Wadel [facility] 03/06/2017, 1:28 PM

## 2017-03-06 NOTE — Progress Notes (Signed)
Patient is eating, ambulating, voiding.  Pain control is good.  Appropriate lochia, no complaints.  Vitals:   03/05/17 1843 03/05/17 1946 03/05/17 2300 03/06/17 0500  BP: 120/65 129/84 122/65 (!) 101/56  Pulse: 89 (!) 102 81 79  Resp: 18 20    Temp: 98 F (36.7 C) 98.5 F (36.9 C) 98.2 F (36.8 C) 98.6 F (37 C)  TempSrc: Oral Oral Oral Oral  SpO2:  99%  99%  Weight:      Height:        Fundus firm Perineum without swelling. Ext: no Ct  Lab Results  Component Value Date   WBC 7.1 03/06/2017   HGB 8.0 (L) 03/06/2017   HCT 24.1 (L) 03/06/2017   MCV 85.8 03/06/2017   PLT 131 (L) 03/06/2017    --/--/A POS (08/30 1052)  A/P Post partum day 1. Anemia - add iron bid  Routine care.  Expect d/c 9/1.    Philip AspenALLAHAN, Leatrice Parilla

## 2017-03-07 ENCOUNTER — Ambulatory Visit: Payer: Self-pay

## 2017-03-07 NOTE — Discharge Summary (Signed)
Obstetric Discharge Summary Reason for Admission: induction of labor,  GDMA1 Prenatal Procedures: ultrasound Intrapartum Procedures: spontaneous vaginal delivery Postpartum Procedures: none Complications-Operative and Postpartum: none Hemoglobin  Date Value Ref Range Status  03/06/2017 8.0 (L) 12.0 - 15.0 g/dL Final   HCT  Date Value Ref Range Status  03/06/2017 24.1 (L) 36.0 - 46.0 % Final    Physical Exam:  General: alert and cooperative Lochia: appropriate Uterine Fundus: firm DVT Evaluation: No evidence of DVT seen on physical exam.  Discharge Diagnoses: Term Pregnancy-delivered  Discharge Information: Date: 03/07/2017 Activity: pelvic rest Diet: routine Medications: PNV and Ibuprofen Condition: stable Instructions: refer to practice specific booklet Discharge to: home Follow-up Information    Carrington ClampHorvath, Michelle, MD Follow up in 4 week(s).   Specialty:  Obstetrics and Gynecology Contact information: [address] [place] [id number] [phone]           Newborn Data: Live born female  Birth Weight: 7 lb 6.7 oz (3365 g) APGAR: 8, 9  Home with mother.  Philip AspenCALLAHAN, Daxter Paule 03/07/2017, 9:48 AM

## 2017-03-07 NOTE — Lactation Note (Signed)
This note was copied from a baby's chart. 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 I fitted mom with 24 nipple shield, and assisted mom with latch in cross cradle, and showed mom how to advance him beyond her nipple. Mom was relieved that this latch was much more comfortable  I was not able to hand express any colostrum, mom is just 41 hours post partum. Her milk should transition in by tomorrow The baby has had adequatewet and dirty diapers, and is at 6% weight loss, WNL. I will see if there is colostrum in the shield after the feeding, [facility] at discharge  I spoke to [name], NNP, and since Monday is a holiday, she would like the baby to stay one more night, and see if her milk comes in, and that the baby's weight is stable.    Maternal Data    Feeding Feeding Type: Breast Fed  LATCH Score Latch: Grasps breast easily, tongue down, lips flanged, rhythmical sucking. (baby latched with 24 nipple shield)     Type of Nipple: Everted at rest and after stimulation  Comfort (Breast/Nipple): Filling, red/small blisters or bruises, mild/mod discomfort (mom with intact nipple, but severe pain with latch, much mor comfortable with nipple shield)  Hold (Positioning): Assistance needed to correctly position infant at breast and maintain latch.     Interventions Interventions: Breast feeding basics reviewed;Assisted with latch;Skin to skin;Hand express;Adjust position;Support pillows;Position options;Expressed milk;Coconut oil;DEBP  Lactation Tools Discussed/Used Pump Review: Setup, frequency, and cleaning;Milk Storage Initiated by:: Teandre Hamre Sabra HeckLee, Rn IBCLC Date initiated:: 03/07/17 (mom to take home pump and  begin puping at home)   Consult Status Consult Status: Complete Follow-up type: Call as needed    [name], [name] 03/07/2017, 9:15 AM

## 2017-03-07 NOTE — Plan of Care (Signed)
Problem: Health Behavior/Discharge Planning: Goal: Ability to manage health-related needs will improve Outcome: Completed/Met Date Met: 03/07/17 Able to manage care needs independently  Problem: Bowel/Gastric: Goal: Will not experience complications related to bowel motility Outcome: Completed/Met Date Met: 03/07/17 Needs to ambulate consistently  Problem: Education: Goal: Knowledge of condition will improve Outcome: Completed/Met Date Met: 03/07/17 Able to teach back care needs of self and baby  Problem: Role Relationship: Goal: Ability to demonstrate positive interaction with newborn will improve Outcome: Completed/Met Date Met: 03/07/17 Able to care for baby independently

## 2017-03-07 NOTE — Progress Notes (Signed)
Patient ready for discharge, able to manage home cares independently, pain controlled, good family support, and able to teach back care needs for herself and baby.

## 2017-03-07 NOTE — Lactation Note (Signed)
This note was copied from a baby's chart. Lactation Consultation Note  Patient Name: Shelley Bass ZOXWR'UToday's Date: 03/07/2017 Reason for consult: Follow-up assessment   With this mom and term baby. Dr. Carma Lairetinaur discharged baby to home. I gave mom a WIC loaner DEP, and instructed her in it;s use.   Maternal Data    Feeding Feeding Type: Bottle Fed - Formula Nipple Type: Slow - flow  LATCH Score                   Interventions    Lactation Tools Discussed/Used     Consult Status Consult Status: Complete Follow-up type: Call as needed    [name], Kineta Fudala Anne 03/07/2017, 3:29 PM

## 2017-03-07 NOTE — Progress Notes (Signed)
Patient reports red itchy spots dime size with swollen hands. Symptoms reported to Physician. Motrin orders D/C'd.

## 2017-03-08 ENCOUNTER — Telehealth (HOSPITAL_COMMUNITY): Payer: Self-pay

## 2017-03-08 LAB — RPR: RPR: NONREACTIVE

## 2017-03-08 NOTE — Telephone Encounter (Signed)
See reason for call under lactation services

## 2017-11-21 IMAGING — US US OB COMP LESS 14 WK
1 series · 13 of 28 positions shown · non-contrast
Comparison: Pelvic ultrasound performed 02/13/2011

CLINICAL DATA: Chronic pelvic pain. Acute onset of vaginal
bleeding. Initial encounter.

EXAM:
OBSTETRIC <14 WK ULTRASOUND
TECHNIQUE: Transabdominal ultrasound was performed for evaluation of the
gestation as well as the maternal uterus and adnexal regions.

[Series 1: us ob comp less 14 wk · 0.21mm/px · 13 of 63 slices shown]
[im 3/63]
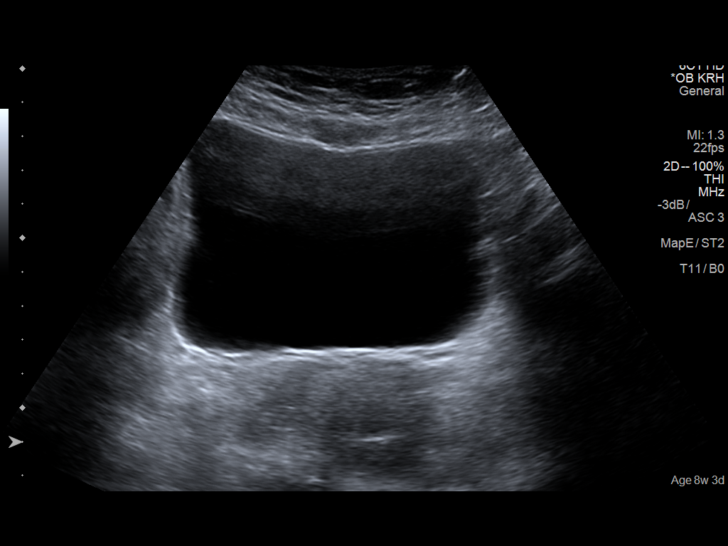
[im 7/63]
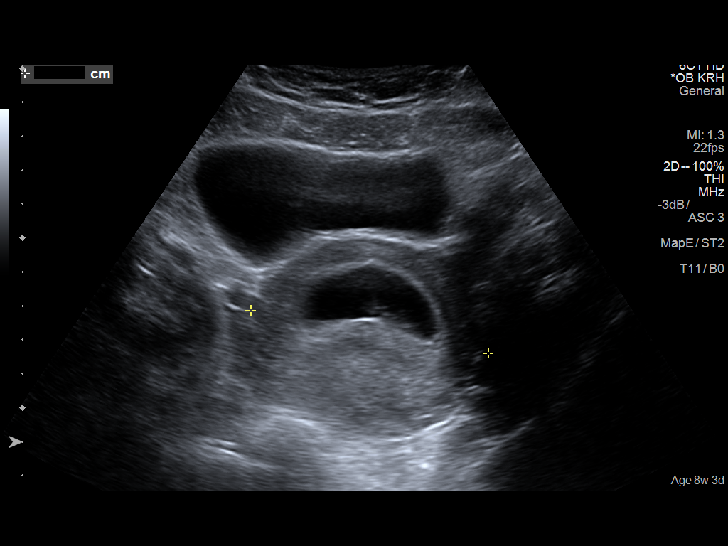
[im 12/63]
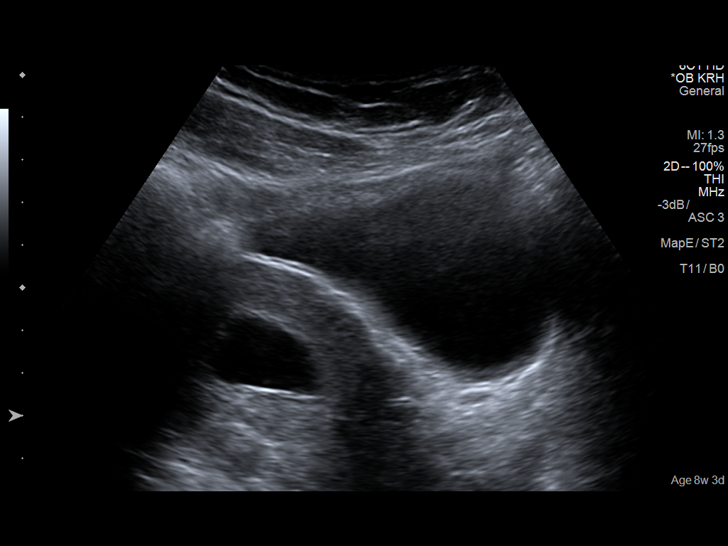
[im 17/63]
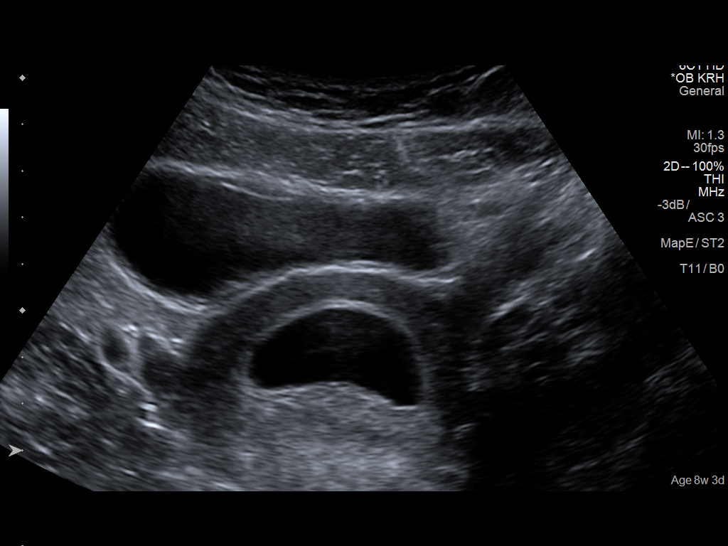
[im 21/63]
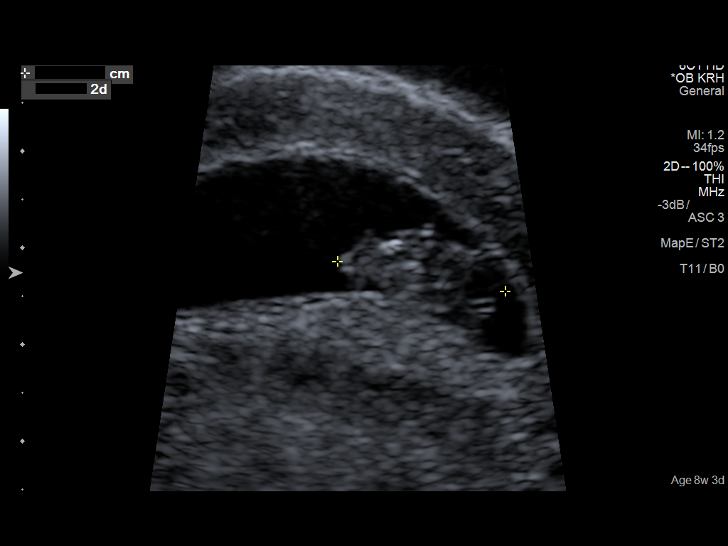
[im 26/63]
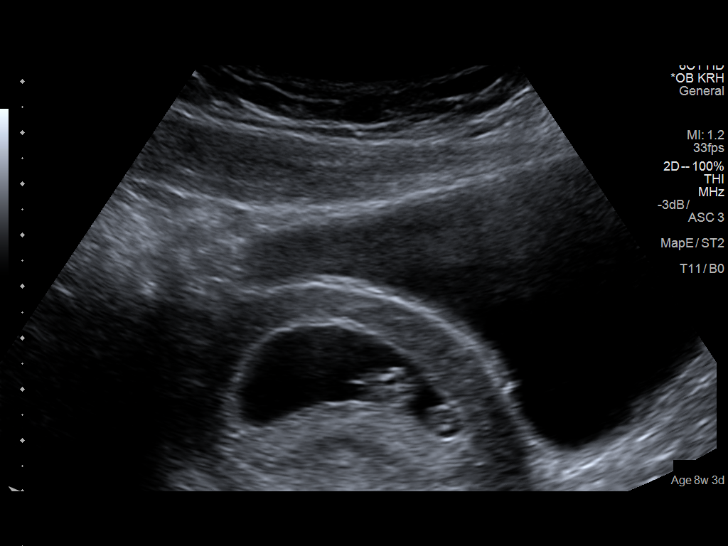
[im 33/63]
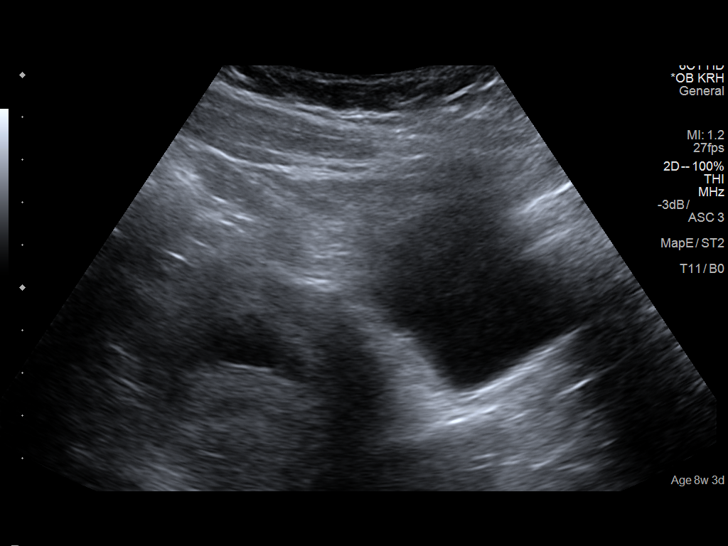
[im 37/63]
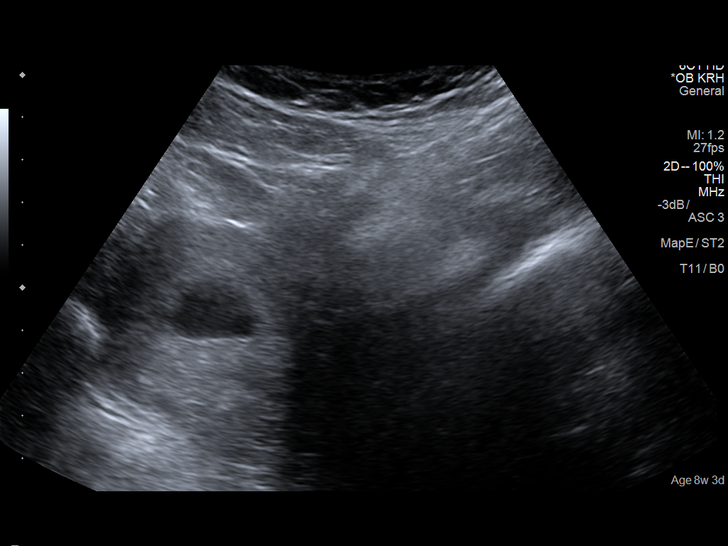
[im 42/63]
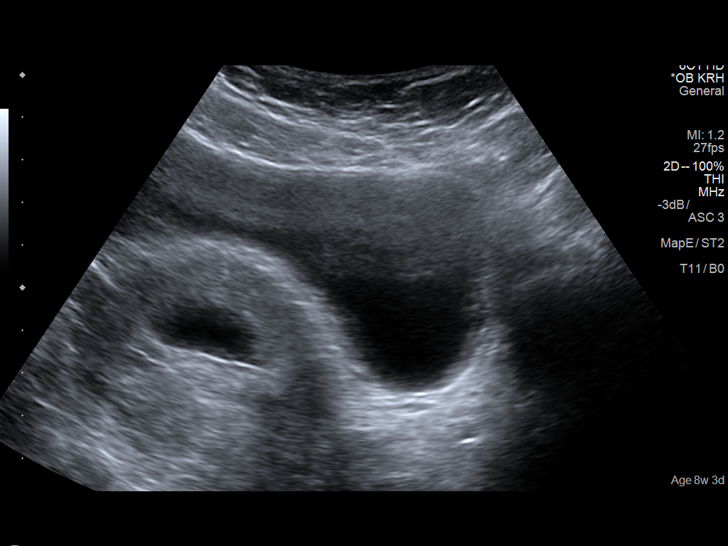
[im 46/63]
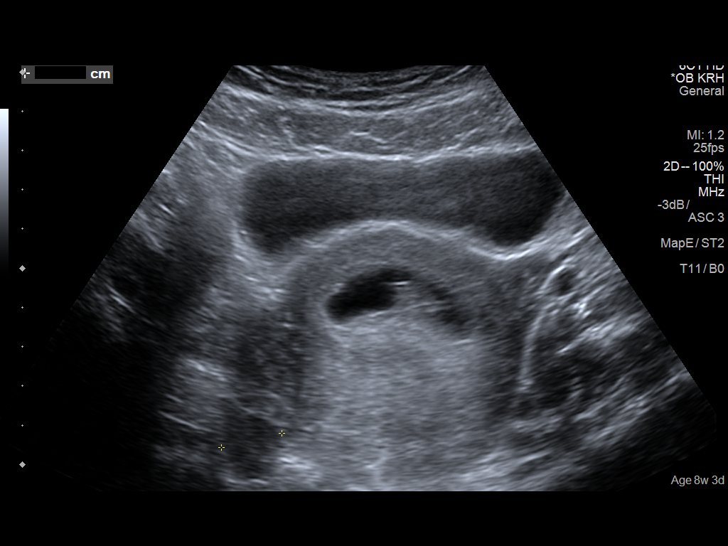
[im 51/63]
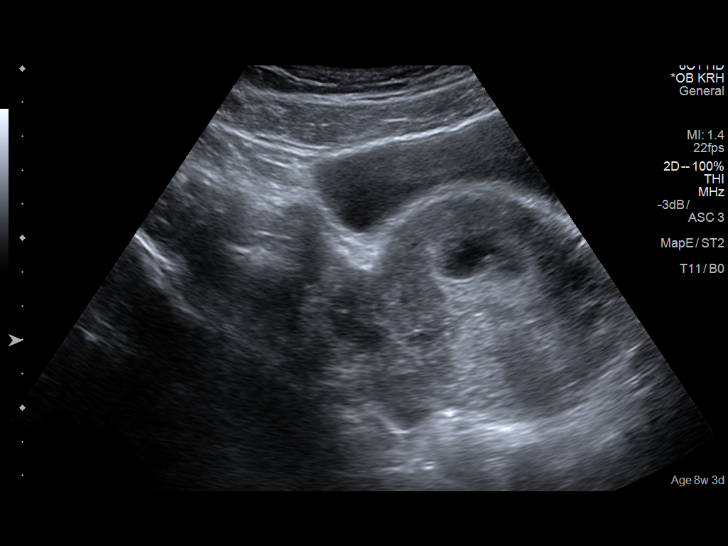
[im 56/63]
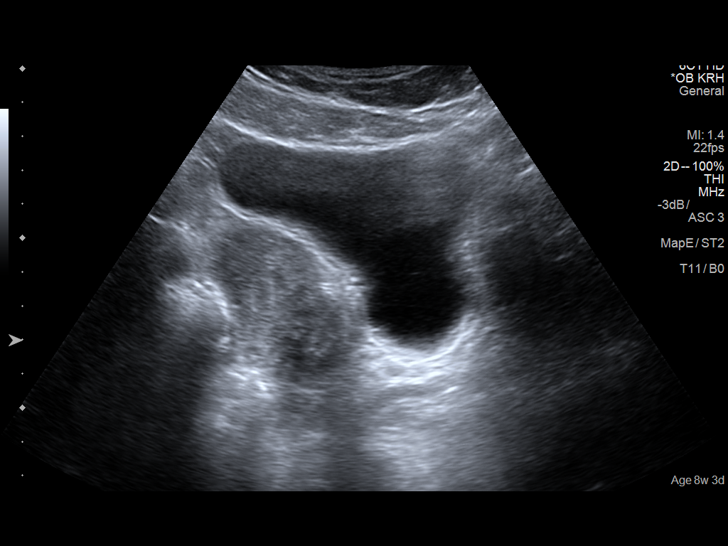
[im 60/63]
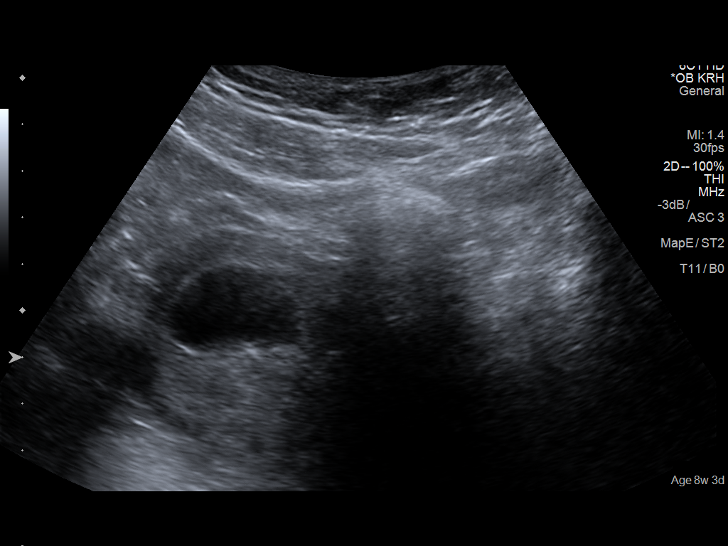

[13 of 28 positions shown; findings below may reference images not displayed]

FINDINGS: Intrauterine gestational sac: Single; visualized and normal in
shape.

Yolk sac:  Yes

Embryo:  Yes

Cardiac Activity: Yes

Heart Rate: 171 bpm

CRL:   1.7 cm   8 w 2 d                  US EDC: 03/14/2017

Subchorionic hemorrhage:  None visualized.

Maternal uterus/adnexae: The uterus is otherwise unremarkable in
appearance. The cervix remains closed.

The ovaries are within normal limits. The right ovary measures 3.8 x
1.6 x 1.6 cm, while the left ovary measures 3.0 x 2.9 x 2.4 cm. No
suspicious adnexal masses are seen; there is no evidence for ovarian
torsion.

No free fluid is seen within the pelvic cul-de-sac.
IMPRESSION: Single live intrauterine pregnancy noted, with a crown-rump length
of 1.7 cm, corresponding to a gestational age of 8 weeks 2 days.
This matches the gestational age of 8 weeks 3 days by LMP,
reflecting an estimated date of delivery March 13, 2017.

## 2019-04-15 ENCOUNTER — Other Ambulatory Visit: Payer: Self-pay

## 2019-04-15 ENCOUNTER — Encounter: Payer: Self-pay | Admitting: Nurse Practitioner

## 2019-04-15 ENCOUNTER — Ambulatory Visit: Payer: BC Managed Care – PPO | Admitting: Nurse Practitioner

## 2019-04-15 VITALS — BP 100/62 | HR 89 | Temp 98.8°F | Ht 65.0 in | Wt 151.0 lb

## 2019-04-15 DIAGNOSIS — H00022 Hordeolum internum right lower eyelid: Secondary | ICD-10-CM | POA: Diagnosis not present

## 2019-04-15 MED ORDER — ERYTHROMYCIN 5 MG/GM OP OINT: 1.0000 "application " | TOPICAL_OINTMENT | Freq: Two times a day (BID) | OPHTHALMIC | 0 refills | Status: DC

## 2019-04-15 NOTE — Patient Instructions (Signed)
Follow these instructions at home:  Take over-the-counter and prescription medicines only as told by your health care provider. This includes eye drops or ointments.  If you were prescribed an antibiotic medicine, apply or use it as told by your health care provider. Do not stop using the antibiotic even if your condition improves.  Apply a warm, wet cloth (warm compress) to your eye for 20 minutes, 3 times a day.  Clean the affected eyelid as directed by your health care provider.  Do not wear contact lenses or eye makeup until your stye has healed.  Do not try to pop or drain the stye.  Do not rub your eye.   Stye  A stye, also known as a hordeolum, is a bump that forms on an eyelid. It may look like a pimple next to the eyelash. A stye can form inside the eyelid (internal stye) or outside the eyelid (external stye). A stye can cause redness, swelling, and pain on the eyelid. Styes are very common. Anyone can get them at any age. They usually occur in just one eye, but you may have more than one in either eye. What are the causes? A stye is caused by an infection. The infection is almost always caused by bacteria called Staphylococcus aureus. This is a common type of bacteria that lives on the skin. An internal stye may result from an infected oil-producing gland inside the eyelid. An external stye may be caused by an infection at the base of the eyelash (hair follicle). What increases the risk? You are more likely to develop a stye if:  You have had a stye before.  You have any of these conditions: ? Diabetes. ? Red, itchy, inflamed eyelids (blepharitis). ? A skin condition such as seborrheic dermatitis or rosacea. ? High fat levels in your blood (lipids). What are the signs or symptoms? The most common symptom of a stye is eyelid pain. Internal styes are more painful than external styes. Other symptoms may include:  Painful swelling of your eyelid.  A scratchy feeling in  your eye.  Tearing and redness of your eye.  Pus draining from the stye. How is this diagnosed? Your health care provider may be able to diagnose a stye just by examining your eye. The health care provider may also check to make sure:  You do not have a fever or other signs of a more serious infection.  The infection has not spread to other parts of your eye or areas around your eye. How is this treated? Most styes will clear up in a few days without treatment or with warm compresses applied to the area. You may need to use antibiotic drops or ointment to treat an infection. In some cases, if your stye does not heal with routine treatment, your health care provider may drain pus from the stye using a thin blade or needle. This may be done if the stye is large, causing a lot of pain, or affecting your vision. Follow these instructions at home:  Take over-the-counter and prescription medicines only as told by your health care provider. This includes eye drops or ointments.  If you were prescribed an antibiotic medicine, apply or use it as told by your health care provider. Do not stop using the antibiotic even if your condition improves.  Apply a warm, wet cloth (warm compress) to your eye for 20 minutes, 3 times a day.  Clean the affected eyelid as directed by your health care provider.  Do not wear contact lenses or eye makeup until your stye has healed.  Do not try to pop or drain the stye.  Do not rub your eye. Contact a health care provider if:  You have chills or a fever.  Your stye does not go away after several days.  Your stye affects your vision.  Your eyeball becomes swollen, red, or painful. Get help right away if:  You have pain when moving your eye around. Summary  A stye is a bump that forms on an eyelid. It may look like a pimple next to the eyelash.  A stye can form inside the eyelid (internal stye) or outside the eyelid (external stye). A stye can cause  redness, swelling, and pain on the eyelid.  Your health care provider may be able to diagnose a stye just by examining your eye.  Apply a warm, wet cloth (warm compress) to your eye for 5-10 minutes, 4 times a day. This information is not intended to replace advice given to you by your health care provider. Make sure you discuss any questions you have with your health care provider. Document Released: 04/02/2005 Document Revised: 06/05/2017 Document Reviewed: 03/05/2017 Elsevier Patient Education  2020 [employer].

## 2019-04-15 NOTE — Progress Notes (Signed)
Subjective:  Patient ID: Shelley Bass, female    DOB: 01/30/1986  Age: 33 y.o. MRN: [id number]  CC: Establish Care (Pt has a sty on her right eye for about 3 days and its painful)   Eye Problem  The right eye is affected. This is a new problem. The current episode started in the past 7 days. The problem occurs constantly. The problem has been gradually worsening. There was no injury mechanism. There is no known exposure to pink eye. She does not wear contacts. Associated symptoms include an eye discharge and a foreign body sensation. Pertinent negatives include no blurred vision, double vision, eye redness, fever, itching, nausea, photophobia, recent URI or vomiting. She has tried eye drops for the symptoms. The treatment provided no relief.   Reviewed past Medical, Social and Family history today.  Outpatient Medications Prior to Visit  Medication Sig Dispense Refill  . acetaminophen (TYLENOL) 325 MG tablet Take 650 mg by mouth every 6 (six) hours as needed for moderate pain.    . Prenatal Vit-Fe Fumarate-FA (NAT-RUL PRENATAL VITAMINS) 28-0.8 MG TABS Take 1 tablet by mouth daily. (Patient not taking: Reported on 04/15/2019) 30 tablet 3   No facility-administered medications prior to visit.     ROS See HPI  Objective:  BP 100/62   Pulse 89   Temp 98.8 F (37.1 C) (Oral)   Ht 5\' 5"  (1.651 m)   Wt 151 lb (68.5 kg)   SpO2 98%   BMI 25.13 kg/m   BP Readings from Last 3 Encounters:  04/15/19 100/62  03/07/17 (!) 98/56  01/28/17 121/68    Wt Readings from Last 3 Encounters:  04/15/19 151 lb (68.5 kg)  03/05/17 176 lb 12.8 oz (80.2 kg)  08/08/16 157 lb (71.2 kg)    Physical Exam Vitals signs reviewed.  Eyes:     General: Vision grossly intact.        Right eye: Discharge and hordeolum present. No foreign body.        Left eye: No foreign body or hordeolum.     Extraocular Movements: Extraocular movements intact.     Conjunctiva/sclera:     Right eye: Right  conjunctiva is injected. Exudate present. No chemosis or hemorrhage.    Left eye: Left conjunctiva is not injected. No chemosis, exudate or hemorrhage.    Pupils: Pupils are equal, round, and reactive to light.  Lymphadenopathy:     Cervical: No cervical adenopathy.  Neurological:     Mental Status: She is alert.    Lab Results  Component Value Date   WBC 7.1 03/06/2017   HGB 8.0 (L) 03/06/2017   HCT 24.1 (L) 03/06/2017   PLT 131 (L) 03/06/2017   GLUCOSE 88 08/04/2016   ALT 13 11/27/2010   AST 15 11/27/2010   NA 134 (L) 08/04/2016   K 3.7 08/04/2016   CL 102 08/04/2016   CREATININE 0.74 08/04/2016   BUN 9 08/04/2016   CO2 24 08/04/2016   INR 1.21 11/30/2009     Assessment & Plan:   Shelley Bass was seen today for establish care.  Diagnoses and all orders for this visit:  Hordeolum internum of right lower eyelid -     erythromycin ophthalmic ointment; Place 1 application into the right eye 2 (two) times daily.   I am having Amil Clutter start on erythromycin. I am also having her maintain her Nat-Rul Prenatal Vitamins and acetaminophen.  Meds ordered this encounter  Medications  . erythromycin ophthalmic ointment  Sig: Place 1 application into the right eye 2 (two) times daily.    Dispense:  3.5 g    Refill:  0    Order Specific Question:   Supervising Provider    Answer:   MATTHEWS, CODY [[id number]]    Problem List Items Addressed This Visit    None    Visit Diagnoses    Hordeolum internum of right lower eyelid    -  Primary   Relevant Medications   erythromycin ophthalmic ointment      Follow-up: Return in about 1 week (around 04/22/2019) for CPE (fasting).  Wilfred Lacy, NP

## 2019-04-28 ENCOUNTER — Telehealth: Payer: Self-pay | Admitting: Nurse Practitioner

## 2019-04-28 NOTE — Telephone Encounter (Signed)

## 2019-04-29 ENCOUNTER — Ambulatory Visit (INDEPENDENT_AMBULATORY_CARE_PROVIDER_SITE_OTHER): Payer: BC Managed Care – PPO | Admitting: Nurse Practitioner

## 2019-04-29 ENCOUNTER — Encounter: Payer: Self-pay | Admitting: Nurse Practitioner

## 2019-04-29 ENCOUNTER — Other Ambulatory Visit: Payer: Self-pay

## 2019-04-29 VITALS — BP 110/80 | HR 91 | Temp 98.2°F | Ht 65.0 in | Wt 148.2 lb

## 2019-04-29 DIAGNOSIS — Z0000 Encounter for general adult medical examination without abnormal findings: Secondary | ICD-10-CM

## 2019-04-29 DIAGNOSIS — Z13220 Encounter for screening for lipoid disorders: Secondary | ICD-10-CM | POA: Diagnosis not present

## 2019-04-29 DIAGNOSIS — Z136 Encounter for screening for cardiovascular disorders: Secondary | ICD-10-CM | POA: Diagnosis not present

## 2019-04-29 DIAGNOSIS — R7989 Other specified abnormal findings of blood chemistry: Secondary | ICD-10-CM | POA: Diagnosis not present

## 2019-04-29 LAB — COMPREHENSIVE METABOLIC PANEL
ALT: 12 U/L (ref 0–35)
AST: 15 U/L (ref 0–37)
Albumin: 4.6 g/dL (ref 3.5–5.2)
Alkaline Phosphatase: 31 U/L — ABNORMAL LOW (ref 39–117)
BUN: 8 mg/dL (ref 6–23)
CO2: 28 mEq/L (ref 19–32)
Calcium: 9.5 mg/dL (ref 8.4–10.5)
Chloride: 103 mEq/L (ref 96–112)
Creatinine, Ser: 0.81 mg/dL (ref 0.40–1.20)
GFR: 98.68 mL/min (ref >60.00)
Glucose, Bld: 89 mg/dL (ref 70–99)
Potassium: 3.8 mEq/L (ref 3.5–5.1)
Sodium: 138 mEq/L (ref 135–145)
Total Bilirubin: 0.7 mg/dL (ref 0.2–1.2)
Total Protein: 7.9 g/dL (ref 6.0–8.3)

## 2019-04-29 LAB — CBC
HCT: 38.4 % (ref 36.0–46.0)
Hemoglobin: 12.8 g/dL (ref 12.0–15.0)
MCHC: 33.4 g/dL (ref 30.0–36.0)
MCV: 95.4 fl (ref 78.0–100.0)
Platelets: 200 10*3/uL (ref 150.0–400.0)
RBC: 4.02 Mil/uL (ref 3.87–5.11)
RDW: 12.5 % (ref 11.5–15.5)
WBC: 5.3 10*3/uL (ref 4.0–10.5)

## 2019-04-29 LAB — LIPID PANEL
Cholesterol: 135 mg/dL (ref 0–200)
HDL: 40.1 mg/dL (ref >39.00)
LDL Cholesterol: 74 mg/dL (ref 0–99)
NonHDL: 94.41
Total CHOL/HDL Ratio: 3
Triglycerides: 101 mg/dL (ref 0.0–149.0)
VLDL: 20.2 mg/dL (ref 0.0–40.0)

## 2019-04-29 LAB — TSH: TSH: 0.29 u[IU]/mL — ABNORMAL LOW (ref 0.35–4.50)

## 2019-04-29 NOTE — Progress Notes (Addendum)
Subjective:    Patient ID: Shelley Bass, female    DOB: 05-Jul-1986, 33 y.o.   MRN: [id number]  Patient presents today for complete physical   HPI  Sexual History (orientation,birth control, marital status, STD):married, scheduled PAP and breast exam with GYN,   Depression/Suicide: Depression screen [facility] 2/9 04/15/2019 01/15/2017 03/05/2016  Decreased Interest 0 0 0  Down, Depressed, Hopeless 0 0 0  PHQ - 2 Score 0 0 0   Vision:up to date  Dental:up to date  Immunizations: (TDAP, Hep C screen, Pneumovax, Influenza, zoster)  Health Maintenance  Topic Date Due  . Flu Shot  10/05/2019*  . Tetanus Vaccine  04/14/2020*  . Pap Smear  10/23/2019  . HIV Screening  Completed  *Topic was postponed. The date shown is not the original due date.   Diet:regular.  Weight:  Wt Readings from Last 3 Encounters:  04/29/19 148 lb 3.2 oz (67.2 kg)  04/15/19 151 lb (68.5 kg)  03/05/17 176 lb 12.8 oz (80.2 kg)   Exercise:none due to work schedule  Fall Risk: Fall Risk  04/15/2019 01/15/2017 03/05/2016  Falls in the past year? 0 No No   Advanced Directive: Advanced Directives 03/05/2017  Does Patient Have a Medical Advance Directive? Yes  Type of Advance Directive Living will  Would patient like information on creating a medical advance directive? -  Pre-existing out of facility DNR order (yellow form or pink MOST form) -     Medications and allergies reviewed with patient and updated if appropriate.  Patient Active Problem List   Diagnosis Date Noted  . Pregnancy 03/05/2017  . Postpartum state 03/05/2017    Current Outpatient Medications on File Prior to Visit  Medication Sig Dispense Refill  . erythromycin ophthalmic ointment Place 1 application into the right eye 2 (two) times daily. 3.5 g 0  . IBUPROFEN PO Take by mouth.    Marland Kitchen OVER THE COUNTER MEDICATION Nature's Bounty vitamins.    Marland Kitchen acetaminophen (TYLENOL) 325 MG tablet Take 650 mg by mouth every 6 (six) hours as needed for  moderate pain.    . Prenatal Vit-Fe Fumarate-FA (NAT-RUL PRENATAL VITAMINS) 28-0.8 MG TABS Take 1 tablet by mouth daily. (Patient not taking: Reported on 04/29/2019) 30 tablet 3   No current facility-administered medications on file prior to visit.     Past Medical History:  Diagnosis Date  . Abortion complicated with hemorrhage   . Gestational diabetes   . History of chlamydia   . Ovarian cyst     Past Surgical History:  Procedure Laterality Date  . DILATION AND EVACUATION     post TAB for complications  . INDUCED ABORTION     surgical    Social History   Socioeconomic History  . Marital status: Single    Spouse name: Not on file  . Number of children: Not on file  . Years of education: Not on file  . Highest education level: Not on file  Occupational History  . Not on file  Social Needs  . Financial resource strain: Not on file  . Food insecurity    Worry: Not on file    Inability: Not on file  . Transportation needs    Medical: Not on file    Non-medical: Not on file  Tobacco Use  . Smoking status: Never Smoker  . Smokeless tobacco: Never Used  Substance and Sexual Activity  . Alcohol use: No  . Drug use: No  . Sexual activity: Yes  Birth control/protection: None  Lifestyle  . Physical activity    Days per week: Not on file    Minutes per session: Not on file  . Stress: Not on file  Relationships  . Social Musicianconnections    Talks on phone: Not on file    Gets together: Not on file    Attends religious service: Not on file    Active member of club or organization: Not on file    Attends meetings of clubs or organizations: Not on file    Relationship status: Not on file  Other Topics Concern  . Not on file  Social History Narrative  . Not on file    Family History  Problem Relation Age of Onset  . Arthritis Mother   . Miscarriages / [place] Mother   . Hypertension Father   . Vision loss Maternal Grandmother        cataracts  . Diabetes  Paternal Grandmother   . Anesthesia problems Neg Hx   . Hypotension Neg Hx   . Malignant hyperthermia Neg Hx   . Pseudochol deficiency Neg Hx         Review of Systems  Constitutional: Negative for fever, malaise/fatigue and weight loss.  HENT: Negative for congestion and sore throat.   Eyes:       Negative for visual changes  Respiratory: Negative for cough and shortness of breath.   Cardiovascular: Negative for chest pain, palpitations and leg swelling.  Gastrointestinal: Negative for blood in stool, constipation, diarrhea and heartburn.  Genitourinary: Negative for dysuria, frequency and urgency.  Musculoskeletal: Negative for falls, joint pain and myalgias.  Skin: Negative for rash.  Neurological: Negative for dizziness, sensory change and headaches.  Endo/Heme/Allergies: Does not bruise/bleed easily.  Psychiatric/Behavioral: Negative for depression, substance abuse and suicidal ideas. The patient is not nervous/anxious.     Objective:   Vitals:   04/29/19 1308  BP: 110/80  Pulse: 91  Temp: 98.2 F (36.8 C)  SpO2: 96%    Body mass index is 24.66 kg/m.   Physical Examination:  Physical Exam Constitutional:      General: She is not in acute distress. HENT:     Head: Normocephalic.     Right Ear: Tympanic membrane, ear canal and external ear normal.     Left Ear: Tympanic membrane, ear canal and external ear normal.     Mouth/Throat:     Pharynx: No oropharyngeal exudate.  Eyes:     General: No scleral icterus.    Extraocular Movements: Extraocular movements intact.     Conjunctiva/sclera: Conjunctivae normal.  Neck:     Musculoskeletal: Normal range of motion and neck supple.     Thyroid: No thyromegaly.  Cardiovascular:     Rate and Rhythm: Normal rate and regular rhythm.     Pulses: Normal pulses.     Heart sounds: Normal heart sounds.  Pulmonary:     Effort: Pulmonary effort is normal.     Breath sounds: Normal breath sounds.  Chest:     Chest  wall: No tenderness.  Abdominal:     General: Bowel sounds are normal. There is no distension.     Palpations: Abdomen is soft.     Tenderness: There is no abdominal tenderness.  Genitourinary:    Comments: Breast and pelvic exam deferred to GYN Musculoskeletal: Normal range of motion.        General: No tenderness.  Lymphadenopathy:     Cervical: No cervical adenopathy.  Skin:  General: Skin is warm and dry.  Neurological:     Mental Status: She is alert and oriented to person, place, and time.  Psychiatric:        Judgment: Judgment normal.     ASSESSMENT and PLAN:  Gisel was seen today for annual exam.  Diagnoses and all orders for this visit:  Preventative health care -     CBC -     Comprehensive metabolic panel -     Lipid panel -     TSH  Encounter for lipid screening for cardiovascular disease -     Lipid panel  Low TSH level -     TSH; Future -     T4, free; Future -     T3, free; Future    No problem-specific Assessment & Plan notes found for this encounter.      Problem List Items Addressed This Visit    None    Visit Diagnoses    Preventative health care    -  Primary   Relevant Orders   CBC (Completed)   Comprehensive metabolic panel (Completed)   Lipid panel (Completed)   TSH (Completed)   Encounter for lipid screening for cardiovascular disease       Relevant Orders   Lipid panel (Completed)   Low TSH level       Relevant Orders   TSH   T4, free   T3, free       Follow up: Return in about 1 year (around 04/28/2020) for CPE (fasting).  Wilfred Lacy, NP

## 2019-04-29 NOTE — Patient Instructions (Signed)
Go to lab for blood draw.  Have PAP results faxed to me once completed.   Health Maintenance, Female Adopting a healthy lifestyle and getting preventive care are important in promoting health and wellness. Ask your health care provider about:  The right schedule for you to have regular tests and exams.  Things you can do on your own to prevent diseases and keep yourself healthy. What should I know about diet, weight, and exercise? Eat a healthy diet   Eat a diet that includes plenty of vegetables, fruits, low-fat dairy products, and lean protein.  Do not eat a lot of foods that are high in solid fats, added sugars, or sodium. Maintain a healthy weight Body mass index (BMI) is used to identify weight problems. It estimates body fat based on height and weight. Your health care provider can help determine your BMI and help you achieve or maintain a healthy weight. Get regular exercise Get regular exercise. This is one of the most important things you can do for your health. Most adults should:  Exercise for at least 150 minutes each week. The exercise should increase your heart rate and make you sweat (moderate-intensity exercise).  Do strengthening exercises at least twice a week. This is in addition to the moderate-intensity exercise.  Spend less time sitting. Even light physical activity can be beneficial. Watch cholesterol and blood lipids Have your blood tested for lipids and cholesterol at 32 years of age, then have this test every 5 years. Have your cholesterol levels checked more often if:  Your lipid or cholesterol levels are high.  You are older than 32 years of age.  You are at high risk for heart disease. What should I know about cancer screening? Depending on your health history and family history, you may need to have cancer screening at various ages. This may include screening for:  Breast cancer.  Cervical cancer.  Colorectal cancer.  Skin cancer.  Lung  cancer. What should I know about heart disease, diabetes, and high blood pressure? Blood pressure and heart disease  High blood pressure causes heart disease and increases the risk of stroke. This is more likely to develop in people who have high blood pressure readings, are of African descent, or are overweight.  Have your blood pressure checked: ? Every 3-5 years if you are 36-33 years of age. ? Every year if you are 48 years old or older. Diabetes Have regular diabetes screenings. This checks your fasting blood sugar level. Have the screening done:  Once every three years after age 77 if you are at a normal weight and have a low risk for diabetes.  More often and at a younger age if you are overweight or have a high risk for diabetes. What should I know about preventing infection? Hepatitis B If you have a higher risk for hepatitis B, you should be screened for this virus. Talk with your health care provider to find out if you are at risk for hepatitis B infection. Hepatitis C Testing is recommended for:  Everyone born from [birth year] through 1964.  Anyone with known risk factors for hepatitis C. Sexually transmitted infections (STIs)  Get screened for STIs, including gonorrhea and chlamydia, if: ? You are sexually active and are younger than 32 years of age. ? You are older than 32 years of age and your health care provider tells you that you are at risk for this type of infection. ? Your sexual activity has changed since you were last  screened, and you are at increased risk for chlamydia or gonorrhea. Ask your health care provider if you are at risk.  Ask your health care provider about whether you are at high risk for HIV. Your health care provider may recommend a prescription medicine to help prevent HIV infection. If you choose to take medicine to prevent HIV, you should first get tested for HIV. You should then be tested every 3 months for as long as you are taking the medicine.  Pregnancy  If you are about to stop having your period (premenopausal) and you may become pregnant, seek counseling before you get pregnant.  Take 400 to 800 micrograms (mcg) of folic acid every day if you become pregnant.  Ask for birth control (contraception) if you want to prevent pregnancy. Osteoporosis and menopause Osteoporosis is a disease in which the bones lose minerals and strength with aging. This can result in bone fractures. If you are 37 years old or older, or if you are at risk for osteoporosis and fractures, ask your health care provider if you should:  Be screened for bone loss.  Take a calcium or vitamin D supplement to lower your risk of fractures.  Be given hormone replacement therapy (HRT) to treat symptoms of menopause. Follow these instructions at home: Lifestyle  Do not use any products that contain nicotine or tobacco, such as cigarettes, e-cigarettes, and chewing tobacco. If you need help quitting, ask your health care provider.  Do not use street drugs.  Do not share needles.  Ask your health care provider for help if you need support or information about quitting drugs. Alcohol use  Do not drink alcohol if: ? Your health care provider tells you not to drink. ? You are pregnant, may be pregnant, or are planning to become pregnant.  If you drink alcohol: ? Limit how much you use to 0-1 drink a day. ? Limit intake if you are breastfeeding.  Be aware of how much alcohol is in your drink. In the [place] one drink equals one 12 oz bottle of beer (355 mL), one 5 oz glass of wine (148 mL), or one 1 oz glass of hard liquor (44 mL). General instructions  Schedule regular health, dental, and eye exams.  Stay current with your vaccines.  Tell your health care provider if: ? You often feel depressed. ? You have ever been abused or do not feel safe at home. Summary  Adopting a healthy lifestyle and getting preventive care are important in promoting health  and wellness.  Follow your health care provider's instructions about healthy diet, exercising, and getting tested or screened for diseases.  Follow your health care provider's instructions on monitoring your cholesterol and blood pressure. This information is not intended to replace advice given to you by your health care provider. Make sure you discuss any questions you have with your health care provider. Document Released: 01/06/2011 Document Revised: 06/16/2018 Document Reviewed: 06/16/2018 Elsevier Patient Education  2020 [employer].

## 2019-05-02 ENCOUNTER — Encounter: Payer: Self-pay | Admitting: Nurse Practitioner

## 2019-05-04 NOTE — Addendum Note (Signed)
Addended by: Leana Gamer on: 05/04/2019 05:34 PM   Modules accepted: Orders

## 2019-06-16 ENCOUNTER — Other Ambulatory Visit: Payer: Self-pay

## 2019-06-17 ENCOUNTER — Other Ambulatory Visit (INDEPENDENT_AMBULATORY_CARE_PROVIDER_SITE_OTHER): Payer: BC Managed Care – PPO

## 2019-06-17 DIAGNOSIS — R7989 Other specified abnormal findings of blood chemistry: Secondary | ICD-10-CM

## 2019-06-17 NOTE — Addendum Note (Signed)
Addended by: Lynnea Ferrier on: 06/17/2019 07:21 AM   Modules accepted: Orders

## 2019-06-17 NOTE — Addendum Note (Signed)
Addended by: Lynnea Ferrier on: 06/17/2019 01:55 PM   Modules accepted: Orders

## 2019-06-18 ENCOUNTER — Encounter: Payer: Self-pay | Admitting: Nurse Practitioner

## 2019-06-18 DIAGNOSIS — R7989 Other specified abnormal findings of blood chemistry: Secondary | ICD-10-CM | POA: Insufficient documentation

## 2019-06-18 LAB — TSH: TSH: 0.37 mIU/L — ABNORMAL LOW

## 2019-06-18 LAB — T4, FREE: Free T4: 1.3 ng/dL (ref 0.8–1.8)

## 2019-06-18 LAB — T3, FREE: T3, Free: 3.4 pg/mL (ref 2.3–4.2)

## 2020-05-07 ENCOUNTER — Ambulatory Visit (INDEPENDENT_AMBULATORY_CARE_PROVIDER_SITE_OTHER): Payer: BC Managed Care – PPO | Admitting: Nurse Practitioner

## 2020-05-07 ENCOUNTER — Other Ambulatory Visit: Payer: Self-pay

## 2020-05-07 ENCOUNTER — Encounter: Payer: Self-pay | Admitting: Nurse Practitioner

## 2020-05-07 VITALS — BP 110/64 | HR 60 | Temp 97.3°F | Ht 65.0 in | Wt 148.8 lb

## 2020-05-07 DIAGNOSIS — R102 Pelvic and perineal pain: Secondary | ICD-10-CM | POA: Diagnosis not present

## 2020-05-07 DIAGNOSIS — N920 Excessive and frequent menstruation with regular cycle: Secondary | ICD-10-CM

## 2020-05-07 LAB — POCT URINALYSIS DIPSTICK
Bilirubin, UA: NEGATIVE
Glucose, UA: NEGATIVE
Ketones, UA: NEGATIVE
Leukocytes, UA: NEGATIVE
Nitrite, UA: NEGATIVE
Protein, UA: NEGATIVE
Spec Grav, UA: 1.025 (ref 1.010–1.025)
Urobilinogen, UA: 0.2 E.U./dL
pH, UA: 6 (ref 5.0–8.0)

## 2020-05-07 LAB — POCT URINE PREGNANCY: Preg Test, Ur: NEGATIVE

## 2020-05-07 NOTE — Patient Instructions (Signed)
Go to lab for urine collection.   

## 2020-05-07 NOTE — Progress Notes (Signed)
Subjective:  Patient ID: Shelley Bass, female    DOB: [birth year]  Age: 34 y.o. MRN: [id number]  CC: Acute Visit (Pt c/o of lower abdominal pain x3 weeks. Pt states the pain comes and goes but it was worse when she was on her period last week. )  Vaginal Bleeding The patient's primary symptoms include vaginal bleeding. The patient's pertinent negatives include no genital lesions, genital odor, genital rash, missed menses, pelvic pain or vaginal discharge. This is a new problem. The current episode started 1 to 4 weeks ago. The problem occurs constantly. The problem has been resolved. The pain is moderate. Associated symptoms include abdominal pain. Pertinent negatives include no anorexia, back pain, chills, constipation, diarrhea, discolored urine, fever, flank pain, frequency, headaches, hematuria, joint pain, joint swelling, nausea, painful intercourse, rash, sore throat, urgency or vomiting. The vaginal discharge was bloody. The vaginal bleeding is heavier than menses. She has been passing clots. She has not been passing tissue. Nothing aggravates the symptoms. She has tried nothing for the symptoms. She is sexually active. No, her partner does not have an STD. She uses nothing for contraception. Her menstrual history has been regular. Her past medical history is significant for ovarian cysts. There is no history of endometriosis, menorrhagia, metrorrhagia, PID, an STD, a terminated pregnancy or vaginosis.  LMP 04/16/20-04/28/20 She declined need for STD screen.  Reviewed past Medical, Social and Family history today.  Outpatient Medications Prior to Visit  Medication Sig Dispense Refill  . acetaminophen (TYLENOL) 325 MG tablet Take 650 mg by mouth every 6 (six) hours as needed for moderate pain.    . IBUPROFEN PO Take by mouth.    Marland Kitchen OVER THE COUNTER MEDICATION Nature's Bounty vitamins.    Marland Kitchen erythromycin ophthalmic ointment Place 1 application into the right eye 2 (two) times daily. (Patient  not taking: Reported on 05/07/2020) 3.5 g 0  . Prenatal Vit-Fe Fumarate-FA (NAT-RUL PRENATAL VITAMINS) 28-0.8 MG TABS Take 1 tablet by mouth daily. (Patient not taking: Reported on 04/29/2019) 30 tablet 3   No facility-administered medications prior to visit.    ROS See HPI  Objective:  BP 110/64 (BP Location: Left Arm, Patient Position: Sitting, Cuff Size: Normal)   Pulse 60   Temp (!) 97.3 F (36.3 C) (Temporal)   Ht 5\' 5"  (1.651 m)   Wt 148 lb 12.8 oz (67.5 kg)   LMP 04/16/2020 (Exact Date)   SpO2 96%   Breastfeeding No   BMI 24.76 kg/m   Physical Exam Vitals reviewed.  Constitutional:      General: She is not in acute distress. Cardiovascular:     Rate and Rhythm: Normal rate.     Pulses: Normal pulses.  Abdominal:     General: There is no distension.     Palpations: Abdomen is soft. There is no mass.     Tenderness: There is abdominal tenderness in the suprapubic area. There is no right CVA tenderness, left CVA tenderness or guarding.  Neurological:     Mental Status: She is alert.     Assessment & Plan:  This visit occurred during the SARS-CoV-2 public health emergency.  Safety protocols were in place, including screening questions prior to the visit, additional usage of staff PPE, and extensive cleaning of exam room while observing appropriate contact time as indicated for disinfecting solutions.   Garima was seen today for acute visit.  Diagnoses and all orders for this visit:  Menorrhagia with regular cycle -     POCT  urinalysis dipstick -     POCT urine pregnancy  Suprapubic abdominal pain -     POCT urinalysis dipstick -     POCT urine pregnancy -     Urine Culture  no obvious indication of acute cystitis  Send urine for culture negative pregnancy test. Need to increase oral hydration.  Problem List Items Addressed This Visit    None    Visit Diagnoses    Menorrhagia with regular cycle    -  Primary   Relevant Orders   POCT urinalysis  dipstick (Completed)   POCT urine pregnancy (Completed)   Suprapubic abdominal pain       Relevant Orders   POCT urinalysis dipstick (Completed)   POCT urine pregnancy (Completed)   Urine Culture      Follow-up: Return if symptoms worsen or fail to improve.  Alysia Penna, NP

## 2020-05-08 LAB — URINE CULTURE
MICRO NUMBER:: 11143173
Result:: NO GROWTH
SPECIMEN QUALITY:: ADEQUATE

## 2020-09-19 ENCOUNTER — Ambulatory Visit: Payer: BC Managed Care – PPO | Admitting: Nurse Practitioner

## 2020-09-19 ENCOUNTER — Other Ambulatory Visit: Payer: Self-pay

## 2020-09-19 ENCOUNTER — Encounter: Payer: Self-pay | Admitting: Nurse Practitioner

## 2020-09-19 VITALS — BP 112/64 | HR 69 | Temp 98.3°F | Ht 64.0 in | Wt 157.2 lb

## 2020-09-19 DIAGNOSIS — R1084 Generalized abdominal pain: Secondary | ICD-10-CM

## 2020-09-19 DIAGNOSIS — Z3A08 8 weeks gestation of pregnancy: Secondary | ICD-10-CM

## 2020-09-19 DIAGNOSIS — R110 Nausea: Secondary | ICD-10-CM | POA: Diagnosis not present

## 2020-09-19 LAB — POCT URINALYSIS DIPSTICK
Bilirubin, UA: NEGATIVE
Blood, UA: NEGATIVE
Glucose, UA: NEGATIVE
Ketones, UA: NEGATIVE
Leukocytes, UA: NEGATIVE
Nitrite, UA: NEGATIVE
Protein, UA: NEGATIVE
Spec Grav, UA: 1.02 (ref 1.010–1.025)
Urobilinogen, UA: 0.2 E.U./dL
pH, UA: 7.5 (ref 5.0–8.0)

## 2020-09-19 LAB — POCT URINE PREGNANCY: Preg Test, Ur: POSITIVE — AB

## 2020-09-19 MED ORDER — ONDANSETRON HCL 4 MG PO TABS: 4.0000 mg | ORAL_TABLET | Freq: Three times a day (TID) | ORAL | 0 refills | Status: DC | PRN

## 2020-09-19 NOTE — Progress Notes (Signed)
Subjective:  Patient ID: Shelley Bass, female    DOB: 06-11-1986  Age: 35 y.o. MRN: [id number]  CC: Acute Visit (Pt c/o pain/cramping in abdominal area and pain under left breast and armpit x2 weeks. Pt also c/o nausea)  GI Problem The primary symptoms include abdominal pain, nausea and diarrhea. Primary symptoms do not include fever, weight loss, fatigue, vomiting, melena, hematemesis, jaundice, hematochezia, dysuria, myalgias, arthralgias or rash. The illness began more than 7 days ago. The onset was gradual. The problem has been gradually worsening.  The illness is also significant for anorexia. The illness does not include chills, dysphagia, odynophagia, bloating, constipation, tenesmus, back pain or itching. Associated medical issues do not include inflammatory bowel disease, GERD or irritable bowel syndrome.  LMP 52months ago, no contraception, no vaginal discharge  Reviewed past Medical, Social and Family history today.  Outpatient Medications Prior to Visit  Medication Sig Dispense Refill  . IBUPROFEN PO Take by mouth.    Marland Kitchen OVER THE COUNTER MEDICATION Nature's Bounty vitamins.    Marland Kitchen acetaminophen (TYLENOL) 325 MG tablet Take 650 mg by mouth every 6 (six) hours as needed for moderate pain. (Patient not taking: Reported on 09/19/2020)    . erythromycin ophthalmic ointment Place 1 application into the right eye 2 (two) times daily. (Patient not taking: No sig reported) 3.5 g 0  . Prenatal Vit-Fe Fumarate-FA (NAT-RUL PRENATAL VITAMINS) 28-0.8 MG TABS Take 1 tablet by mouth daily. (Patient not taking: No sig reported) 30 tablet 3   No facility-administered medications prior to visit.    ROS See HPI  Objective:  BP 112/64 (BP Location: Left Arm, Patient Position: Sitting, Cuff Size: Normal)   Pulse 69   Temp 98.3 F (36.8 C) (Temporal)   Ht 5\' 4"  (1.626 m)   Wt 157 lb 3.2 oz (71.3 kg)   SpO2 98%   BMI 26.98 kg/m   Physical Exam Constitutional:      General: She is not in  acute distress.    Appearance: She is ill-appearing.  Cardiovascular:     Rate and Rhythm: Normal rate and regular rhythm.     Pulses: Normal pulses.     Heart sounds: Normal heart sounds.  Pulmonary:     Effort: Pulmonary effort is normal.     Breath sounds: Normal breath sounds.  Abdominal:     General: Bowel sounds are normal. There is no distension.     Palpations: Abdomen is soft. There is no mass.     Tenderness: There is abdominal tenderness in the suprapubic area. There is no right CVA tenderness, left CVA tenderness or guarding.     Hernia: No hernia is present.  Neurological:     Mental Status: She is alert and oriented to person, place, and time.     Assessment & Plan:  This visit occurred during the SARS-CoV-2 public health emergency.  Safety protocols were in place, including screening questions prior to the visit, additional usage of staff PPE, and extensive cleaning of exam room while observing appropriate contact time as indicated for disinfecting solutions.   Rosezetta was seen today for acute visit.  Diagnoses and all orders for this visit:  Generalized abdominal pain -     POCT urine pregnancy -     POCT urinalysis dipstick -     [facility] PELVIS LIMITED (TRANSABDOMINAL ONLY); Future -     CBC w/Diff -     Comprehensive metabolic panel  Nausea -     POCT urine pregnancy -  POCT urinalysis dipstick -     US PELVIS LIMITED (TRANSABDOMINAL ONLY); Future -     CBC w/Diff -     Comprehensive metabolic panel -     ondansetron (ZOFRAN) 4 MG tablet; Take 1 tablet (4 mg total) by mouth every 8 (eight) hours as needed for nausea or vomiting.  [redacted] weeks gestation of pregnancy -     POCT urine pregnancy -     US PELVIS LIMITED (TRANSABDOMINAL ONLY); Future  She agreed to schedule an appt with [facility].  Problem List Items Addressed This Visit      Other   Pregnancy   Relevant Orders   POCT urine pregnancy (Completed)   US PELVIS LIMITED (TRANSABDOMINAL  ONLY)    Other Visit Diagnoses    Generalized abdominal pain    -  Primary   Relevant Orders   POCT urine pregnancy (Completed)   POCT urinalysis dipstick (Completed)   US PELVIS LIMITED (TRANSABDOMINAL ONLY)   CBC w/Diff   Comprehensive metabolic panel   Nausea       Relevant Medications   ondansetron (ZOFRAN) 4 MG tablet   Other Relevant Orders   POCT urine pregnancy (Completed)   POCT urinalysis dipstick (Completed)   US PELVIS LIMITED (TRANSABDOMINAL ONLY)   CBC w/Diff   Comprehensive metabolic panel      Follow-up: No follow-ups on file.  Alysia Penna, NP

## 2020-09-19 NOTE — Patient Instructions (Signed)
Schedule appt with OB You will be contacted to schedule appt for ABD [place].  First Trimester of Pregnancy  The first trimester of pregnancy starts on the first day of your last menstrual period until the end of week 12. This is also called months 1 through 3 of pregnancy. Body changes during your first trimester Your body goes through many changes during pregnancy. The changes usually return to normal after your baby is born. Physical changes  You may gain or lose weight.  Your breasts may grow larger and hurt. The area around your nipples may get darker.  Dark spots or blotches may develop on your face.  You may have changes in your hair. Health changes  You may feel like you might vomit (nauseous), and you may vomit.  You may have heartburn.  You may have headaches.  You may have trouble pooping (constipation).  Your gums may bleed. Other changes  You may get tired easily.  You may pee (urinate) more often.  Your menstrual periods will stop.  You may not feel hungry.  You may want to eat certain kinds of food.  You may have changes in your emotions from day to day.  You may have more dreams. Follow these instructions at home: Medicines  Take over-the-counter and prescription medicines only as told by your doctor. Some medicines are not safe during pregnancy.  Take a prenatal vitamin that contains at least 600 micrograms (mcg) of folic acid. Eating and drinking  Eat healthy meals that include: ? Fresh fruits and vegetables. ? Whole grains. ? Good sources of protein, such as meat, eggs, or tofu. ? Low-fat dairy products.  Avoid raw meat and unpasteurized juice, milk, and cheese.  If you feel like you may vomit, or you vomit: ? Eat 4 or 5 small meals a day instead of 3 large meals. ? Try eating a few soda crackers. ? Drink liquids between meals instead of during meals.  You may need to take these actions to prevent or treat trouble pooping: ? Drink enough  fluids to keep your pee (urine) pale yellow. ? Eat foods that are high in fiber. These include beans, whole grains, and fresh fruits and vegetables. ? Limit foods that are high in fat and sugar. These include fried or sweet foods. Activity  Exercise only as told by your doctor. Most people can do their usual exercise routine during pregnancy.  Stop exercising if you have cramps or pain in your lower belly (abdomen) or low back.  Do not exercise if it is too hot or too humid, or if you are in a place of great height (high altitude).  Avoid heavy lifting.  If you choose to, you may have sex unless your doctor tells you not to. Relieving pain and discomfort  Wear a good support bra if your breasts are sore.  Rest with your legs raised (elevated) if you have leg cramps or low back pain.  If you have bulging veins (varicose veins) in your legs: ? Wear support hose as told by your doctor. ? Raise your feet for 15 minutes, 3-4 times a day. ? Limit salt in your food. Safety  Wear your seat belt at all times when you are in a car.  Talk with your doctor if someone is hurting you or yelling at you.  Talk with your doctor if you are feeling sad or have thoughts of hurting yourself. Lifestyle  Do not use hot tubs, steam rooms, or saunas.  Do  not douche. Do not use tampons or scented sanitary pads.  Do not use herbal medicines, illegal drugs, or medicines that are not approved by your doctor. Do not drink alcohol.  Do not smoke or use any products that contain nicotine or tobacco. If you need help quitting, ask your doctor.  Avoid cat litter boxes and soil that is used by cats. These carry germs that can cause harm to the baby and can cause a loss of your baby by miscarriage or stillbirth. General instructions  Keep all follow-up visits. This is important.  Ask for help if you need counseling or if you need help with nutrition. Your doctor can give you advice or tell you where to go  for help.  Visit your dentist. At home, brush your teeth with a soft toothbrush. Floss gently.  Write down your questions. Take them to your prenatal visits. Where to find more information  American Pregnancy Association: americanpregnancy.org  [employer]: www.acog.org  Office on Women's Health: MightyReward.co.nz Contact a doctor if:  You are dizzy.  You have a fever.  You have mild cramps or pressure in your lower belly.  You have a nagging pain in your belly area.  You continue to feel like you may vomit, you vomit, or you have watery poop (diarrhea) for 24 hours or longer.  You have a bad-smelling fluid coming from your vagina.  You have pain when you pee.  You are exposed to a disease that spreads from person to person, such as chickenpox, measles, Zika virus, HIV, or hepatitis. Get help right away if:  You have spotting or bleeding from your vagina.  You have very bad belly cramping or pain.  You have shortness of breath or chest pain.  You have any kind of injury, such as from a fall or a car crash.  You have new or increased pain, swelling, or redness in an arm or leg. Summary  The first trimester of pregnancy starts on the first day of your last menstrual period until the end of week 12 (months 1 through 3).  Eat 4 or 5 small meals a day instead of 3 large meals.  Do not smoke or use any products that contain nicotine or tobacco. If you need help quitting, ask your doctor.  Keep all follow-up visits. This information is not intended to replace advice given to you by your health care provider. Make sure you discuss any questions you have with your health care provider. Document Revised: 11/30/2019 Document Reviewed: 10/06/2019 Elsevier Patient Education  2021 [employer].

## 2020-09-20 ENCOUNTER — Ambulatory Visit (HOSPITAL_BASED_OUTPATIENT_CLINIC_OR_DEPARTMENT_OTHER)
Admission: RE | Admit: 2020-09-20 | Discharge: 2020-09-20 | Disposition: A | Discharge status: Home / Self Care | Payer: BC Managed Care – PPO | Source: Ambulatory Visit | Attending: Nurse Practitioner | Admitting: Nurse Practitioner

## 2020-09-20 ENCOUNTER — Other Ambulatory Visit: Payer: Self-pay | Admitting: Nurse Practitioner

## 2020-09-20 DIAGNOSIS — R110 Nausea: Secondary | ICD-10-CM | POA: Insufficient documentation

## 2020-09-20 DIAGNOSIS — R1084 Generalized abdominal pain: Secondary | ICD-10-CM | POA: Diagnosis present

## 2020-09-20 DIAGNOSIS — Z3A08 8 weeks gestation of pregnancy: Secondary | ICD-10-CM | POA: Insufficient documentation

## 2020-10-01 ENCOUNTER — Telehealth: Payer: Self-pay | Admitting: Nurse Practitioner

## 2020-10-01 ENCOUNTER — Ambulatory Visit (INDEPENDENT_AMBULATORY_CARE_PROVIDER_SITE_OTHER): Payer: BC Managed Care – PPO | Admitting: Family Medicine

## 2020-10-01 ENCOUNTER — Other Ambulatory Visit: Payer: Self-pay

## 2020-10-01 ENCOUNTER — Encounter: Payer: Self-pay | Admitting: Family Medicine

## 2020-10-01 VITALS — BP 120/72 | HR 68 | Temp 97.5°F | Ht 64.0 in | Wt 165.6 lb

## 2020-10-01 DIAGNOSIS — Z9889 Other specified postprocedural states: Secondary | ICD-10-CM | POA: Diagnosis not present

## 2020-10-01 DIAGNOSIS — F4321 Adjustment disorder with depressed mood: Secondary | ICD-10-CM

## 2020-10-01 MED ORDER — PAROXETINE HCL 20 MG PO TABS: 20.0000 mg | ORAL_TABLET | Freq: Every day | ORAL | 3 refills | Status: DC

## 2020-10-01 NOTE — Telephone Encounter (Signed)
Pt states the pain is from termination of the pregnancy and she really would like to be seen to discuss the emotional toll that it is having on her. Pt states she scheduled an acute visit with a provider (Dr. Veto Kemps) for today.

## 2020-10-01 NOTE — Patient Instructions (Signed)
Schedule appointment for counseling with Merry Lofty, LCSW

## 2020-10-01 NOTE — Progress Notes (Signed)
[facility] [address] [place] [address] Dept: [phone] Dept Fax: [phone]  Office Visit  Subjective:    Patient ID: Shelley Bass, female    DOB: 1985/10/15, 35 y.o..   MRN: [id number]  Chief Complaint  Patient presents with  . Acute Visit    C/o having emotional distress & light bleeding after terminating a pregnancy on 09/25/20.     History of Present Illness:  Patient is in today for follow-up from her recent elective abortion. Shelley Bass was seen on 09/19/2020 and discovered to be ~ [redacted] weeks pregnant. She discussed the situation with her SO. As he was not supportive of her having a baby, along with many other practical reasons they discussed about how difficult it would be for her to have another child, she made the decision to terminate the pregnancy. She [facility] an elective [facility] through [facility]. She is having some light bleeding and passing occasional clots. She has crampy lower abdominal pain associated with this, with occasional radiation of pain ot the right anterior thigh. She has been taking 800 mg of ibuprofen every 4-6 hours for the pain.  Shelley Bass notes that emotionally she has been struggling since the abortion. She admits to some feeling of regret. She accepts that she freely chose to pursue the abortion, mainly because her SO was not supportive of her having the baby. She can accept that having an additional child at this point would be quite difficult. Her relationship with her So is no solid and she is finding him to be distant since the abortion. Her mother lives locally, but they are not close. She has a friend, who is a Educational psychologist. She worries that if she talked with her about her current circumstances that word would spread at work. She is a Runner, broadcasting/film/video for high school students. Shelley Bass notes that she has been breaking down in tears frequently. She also has had feelings of  anger and some hopelessness about her current situation. She is findign she has little energy and wants to isolate in her bed. She is not eating or drinking very much. She has told her children she has a stomach virus. They are concerned for her well-being and she feels sad about not being able to provide her usual level of care for them. She also does not feel she is able to return to work at this point. She admits to thoughts of wishing she were dead, but denies making any plans for suicide.  Past Medical History: Patient Active Problem List   Diagnosis Date Noted  . Low TSH level 06/18/2019   Past Surgical History:  Procedure Laterality Date  . DILATION AND EVACUATION     post TAB for complications  . INDUCED ABORTION     surgical   Family History  Problem Relation Age of Onset  . Arthritis Mother   . Miscarriages / [place] Mother   . Hypertension Father   . Vision loss Maternal Grandmother        cataracts  . Diabetes Paternal Grandmother   . Anesthesia problems Neg Hx   . Hypotension Neg Hx   . Malignant hyperthermia Neg Hx   . Pseudochol deficiency Neg Hx    Outpatient Medications Prior to Visit  Medication Sig Dispense Refill  . IBUPROFEN PO Take by mouth.    . ondansetron (ZOFRAN) 4 MG tablet Take 1 tablet (4 mg total) by mouth every 8 (eight) hours as needed  for nausea or vomiting. 30 tablet 0  . OVER THE COUNTER MEDICATION Nature's Bounty vitamins.    Marland Kitchen acetaminophen (TYLENOL) 325 MG tablet Take 650 mg by mouth every 6 (six) hours as needed for moderate pain. (Patient not taking: No sig reported)    . erythromycin ophthalmic ointment Place 1 application into the right eye 2 (two) times daily. (Patient not taking: No sig reported) 3.5 g 0  . Prenatal Vit-Fe Fumarate-FA (NAT-RUL PRENATAL VITAMINS) 28-0.8 MG TABS Take 1 tablet by mouth daily. (Patient not taking: No sig reported) 30 tablet 3   No facility-administered medications prior to visit.   Allergies  Allergen  Reactions  . Aspirin Hives    Objective:   Today's Vitals   10/01/20 0941  BP: 120/72  Pulse: 68  Temp: (!) 97.5 F (36.4 C)  TempSrc: Temporal  SpO2: 97%  Weight: 165 lb 9.6 oz (75.1 kg)  Height: 5\' 4"  (1.626 m)   Body mass index is 28.43 kg/m.   General: Well developed, well nourished. No acute distress. Psych: Alert and oriented. Frequently tearful. Sad affect with depressed mood.  Health Maintenance Due  Topic Date Due  . Hepatitis C Screening  Never done  . TETANUS/TDAP  Never done  . PAP SMEAR-Modifier  10/23/2019  . INFLUENZA VACCINE  Never done     Assessment & Plan:   1. Adjustment reaction with depressed mood I spent time counseling Shelley Bass in regards to her current acute depressive feelings. We discussed how this can be a normal reaction to having terminated a pregnancy. I urged her to eat and drink, despite her lack of appetite. I also strongly urged her to go for walks each day, rather than isolate at home. I recommend she make an appointment for counseling with [name]. I will start her on an SSRI, but did discuss it will take time for this to have a positive effect. I provided her with a work excuse for the next two weeks.  I will have her follow-up with either me or [name] prior to being released back for work.  - PARoxetine (PAXIL) 20 MG tablet; Take 1 tablet (20 mg total) by mouth daily.  Dispense: 30 tablet; Refill: 3  2. Status post elective abortion Her bleeding and cramping are as anticipated after her EAB. I recommend she use the ibuprofen no more than 4 times a day. We began discussion about potential birth control options. She used Depo-Provera some years ago, but did get pregnant while on this. She is interested in possibly using a Nuva-Ring. We will discuss further at follow-up.  10/25/2019, MD

## 2020-10-01 NOTE — Telephone Encounter (Signed)
Pt called and said she was having abdominal pain and talked about her recent pregnancy. I transferred her to triage just to be safe

## 2020-10-01 NOTE — Telephone Encounter (Signed)
Please ensure she has reached out to her OB

## 2020-10-04 ENCOUNTER — Telehealth: Payer: Self-pay | Admitting: Family Medicine

## 2020-10-04 NOTE — Telephone Encounter (Signed)
Please see form in your basket.  Dm/cma

## 2020-10-04 NOTE — Telephone Encounter (Signed)
Pt dropped off FMLA paper work and I put in Dr Rudds folder up front. She asked for it to be faxed and if we could give her a call [phone] once done. She was worried as well and wanted to make sure it stays confidential from her office, I did let her know that we only send to the fax number on the form which is her HR department

## 2020-10-08 DIAGNOSIS — Z0279 Encounter for issue of other medical certificate: Secondary | ICD-10-CM

## 2020-10-08 NOTE — Telephone Encounter (Signed)
FMLA formed filled out by provider and faxed to [phone] Attn: Alfonso Patten.  Patient notified and form placed up front for pick up. Dm/cma

## 2020-10-09 ENCOUNTER — Telehealth: Payer: Self-pay | Admitting: Family Medicine

## 2020-10-09 ENCOUNTER — Other Ambulatory Visit: Payer: Self-pay

## 2020-10-09 NOTE — Telephone Encounter (Signed)
Pt called and said she believes she is having an allergic reaction to medication prescribed by Dr Veto Kemps, I transferred pt to nurse triage to be further evaluated

## 2020-10-10 ENCOUNTER — Encounter: Payer: Self-pay | Admitting: Family Medicine

## 2020-10-10 ENCOUNTER — Ambulatory Visit: Payer: BC Managed Care – PPO | Admitting: Family Medicine

## 2020-10-10 VITALS — BP 116/72 | HR 62 | Temp 97.9°F | Ht 64.0 in | Wt 159.2 lb

## 2020-10-10 DIAGNOSIS — T43205A Adverse effect of unspecified antidepressants, initial encounter: Secondary | ICD-10-CM | POA: Diagnosis not present

## 2020-10-10 DIAGNOSIS — F4321 Adjustment disorder with depressed mood: Secondary | ICD-10-CM | POA: Insufficient documentation

## 2020-10-10 MED ORDER — VENLAFAXINE HCL ER 37.5 MG PO CP24: 37.5000 mg | ORAL_CAPSULE | Freq: Every day | ORAL | 0 refills | Status: DC

## 2020-10-10 NOTE — Progress Notes (Signed)
[facility] [address] [place] [address] Dept: [phone] Dept Fax: [phone]  Office Visit  Subjective:    Patient ID: Shelley Bass, female    DOB: 06-14-1986, 35 y.o..   MRN: [id number]  Chief Complaint  Patient presents with  . Follow-up    F/u to discuss changing medications due to side effects.        History of Present Illness:  Patient is in today for complaining of generalized itching, esp. over the back for the past 2 days. She associates this with her new antidepressant, Paxil. She stopped taking the medication. She is now taking Benadryl for the itching. She thought she might be developing a rash on her forearms, but has not seen anything today.  Shelley Bass notes her mood is about the same as when I saw her last week. I had diagnosed her with an adjustment reaction related to her recently having had an abortion. We started her on an antidepressant and I recommended her for counseling. She notes she has not heard back from the counselor. She has been able to get out of the house with her daughter and go for a walk. However, she continues to find herself feeling very tired, sleeping more than usual, and having issues with distracted thoughts and lack of focus.  Past Medical History: Patient Active Problem List   Diagnosis Date Noted  . Low TSH level 06/18/2019   Past Surgical History:  Procedure Laterality Date  . DILATION AND EVACUATION     post TAB for complications  . INDUCED ABORTION     surgical   Family History  Problem Relation Age of Onset  . Arthritis Mother   . Miscarriages / [place] Mother   . Hypertension Father   . Vision loss Maternal Grandmother        cataracts  . Diabetes Paternal Grandmother   . Anesthesia problems Neg Hx   . Hypotension Neg Hx   . Malignant hyperthermia Neg Hx   . Pseudochol deficiency Neg Hx    Outpatient Medications Prior to Visit  Medication Sig  Dispense Refill  . IBUPROFEN PO Take by mouth.    . ondansetron (ZOFRAN) 4 MG tablet Take 1 tablet (4 mg total) by mouth every 8 (eight) hours as needed for nausea or vomiting. 30 tablet 0  . OVER THE COUNTER MEDICATION Nature's Bounty vitamins.    Marland Kitchen PARoxetine (PAXIL) 20 MG tablet Take 1 tablet (20 mg total) by mouth daily. 30 tablet 3   No facility-administered medications prior to visit.   Allergies  Allergen Reactions  . Aspirin Hives  . Paxil [Paroxetine] Itching    Objective:   Today's Vitals   10/10/20 1143  BP: 116/72  Pulse: 62  Temp: 97.9 F (36.6 C)  TempSrc: Temporal  SpO2: 98%  Weight: 159 lb 3.2 oz (72.2 kg)  Height: 5\' 4"  (1.626 m)   Body mass index is 27.33 kg/m.   General: Well developed, well nourished. No acute distress. Skin: Warm and dry. No rashes noted. Psych: Alert and oriented. Depressed mood with flat affect, but no tearfulness today.  Health Maintenance Due  Topic Date Due  . Hepatitis C Screening  Never done  . TETANUS/TDAP  Never done  . PAP SMEAR-Modifier  10/23/2019     Assessment & Plan:   1. Adverse reaction to antidepressant drug, initial encounter Itching likely related to Paxil. Patient has already stopped the medication. I would anticipate the itching will  improve over the next few days. She can continue to use Benadryl as needed.  2. Adjustment reaction with depressed mood I will switch her to Effexor. our Research officer, political party, Janna Arch, will personally check in to what the delay has been regarding BH connecting with Shelley Bass. I gave Shelley Bass a business card for Shelley Bass. She is to call her if she has not been contacted within 48 hours.   - venlafaxine XR (EFFEXOR XR) 37.5 MG 24 hr capsule; Take 1 capsule (37.5 mg total) by mouth daily with breakfast.  Dispense: 30 capsule; Refill: 0  Loyola Mast, MD

## 2020-10-11 ENCOUNTER — Ambulatory Visit (INDEPENDENT_AMBULATORY_CARE_PROVIDER_SITE_OTHER): Payer: BC Managed Care – PPO | Admitting: Psychology

## 2020-10-11 DIAGNOSIS — F4310 Post-traumatic stress disorder, unspecified: Secondary | ICD-10-CM

## 2020-10-11 DIAGNOSIS — F331 Major depressive disorder, recurrent, moderate: Secondary | ICD-10-CM

## 2020-10-15 ENCOUNTER — Other Ambulatory Visit: Payer: Self-pay

## 2020-10-15 ENCOUNTER — Encounter: Payer: Self-pay | Admitting: Family Medicine

## 2020-10-15 ENCOUNTER — Ambulatory Visit: Payer: BC Managed Care – PPO | Admitting: Psychology

## 2020-10-15 ENCOUNTER — Ambulatory Visit: Payer: BC Managed Care – PPO | Admitting: Family Medicine

## 2020-10-15 ENCOUNTER — Telehealth: Payer: Self-pay

## 2020-10-15 VITALS — BP 110/60 | HR 58 | Temp 98.3°F | Ht 64.0 in | Wt 159.6 lb

## 2020-10-15 DIAGNOSIS — F4321 Adjustment disorder with depressed mood: Secondary | ICD-10-CM | POA: Diagnosis not present

## 2020-10-15 DIAGNOSIS — L299 Pruritus, unspecified: Secondary | ICD-10-CM

## 2020-10-15 NOTE — Progress Notes (Signed)
[facility] [address] [place] [address] Dept: [phone] Dept Fax: [phone]  Office Visit  Subjective:    Patient ID: Shelley Bass, female    DOB: 02-03-1986, 35 y.o..   MRN: [id number]  Chief Complaint  Patient presents with  . Follow-up    2 week f/u depression.  She hasn't started the Venlafaxine yet.      History of Present Illness:  Patient is in today for reassessment of her adjustment reaction. I saw her initial on 3/28 with acute depressive symptoms related to a recent elective abortion. I started her on Paxil and referred her for counseling. She presented last week (4/6) with generalized itching that appeared to be related to her medication. This was stopped and Effexor was prescribed. Additionally, she had not been contacted back by the counselor. I had staff assist with getting this contact established.  Shelley Bass notes continued depressive symptoms. She did go for a walk a few times last week, but isolated in her home/bedroom through the weekend. She is providing her three children with the minimal level of support they need. She admits to overall feelings of guilt related to her abortion and what care she is providing her children. She did not pick up the Effexor from the pharmacy and notes she continues to have some mild itching. She was able to start counseling with Shelley Bass on Friday and has another session scheduled for today. She thought this did go well.  Past Medical History: Patient Active Problem List   Diagnosis Date Noted  . Adjustment reaction with depressed mood 10/10/2020  . Low TSH level 06/18/2019   Past Surgical History:  Procedure Laterality Date  . DILATION AND EVACUATION     post TAB for complications  . INDUCED ABORTION     surgical   Family History  Problem Relation Age of Onset  . Arthritis Mother   . Miscarriages / [place] Mother   . Hypertension Father   .  Vision loss Maternal Grandmother        cataracts  . Diabetes Paternal Grandmother   . Anesthesia problems Neg Hx   . Hypotension Neg Hx   . Malignant hyperthermia Neg Hx   . Pseudochol deficiency Neg Hx    Outpatient Medications Prior to Visit  Medication Sig Dispense Refill  . IBUPROFEN PO Take by mouth.    . ondansetron (ZOFRAN) 4 MG tablet Take 1 tablet (4 mg total) by mouth every 8 (eight) hours as needed for nausea or vomiting. 30 tablet 0  . OVER THE COUNTER MEDICATION Nature's Bounty vitamins.    Marland Kitchen venlafaxine XR (EFFEXOR XR) 37.5 MG 24 hr capsule Take 1 capsule (37.5 mg total) by mouth daily with breakfast. (Patient not taking: Reported on 10/15/2020) 30 capsule 0   No facility-administered medications prior to visit.   Allergies  Allergen Reactions  . Aspirin Hives  . Paxil [Paroxetine] Itching    Objective:   Today's Vitals   10/15/20 1112  BP: 110/60  Pulse: (!) 58  Temp: 98.3 F (36.8 C)  TempSrc: Temporal  SpO2: 97%  Weight: 159 lb 9.6 oz (72.4 kg)  Height: 5\' 4"  (1.626 m)   Body mass index is 27.4 kg/m.   General: Well developed, well nourished. No acute distress. Skin: Warm and dry. No rashes. Psych: Alert and oriented. Depressed mood and sad affect with occasional tearfulness and emotional reactions affecting her speech..  Health Maintenance Due  Topic Date Due  .  Hepatitis C Screening  Never done  . TETANUS/TDAP  Never done  . PAP SMEAR-Modifier  10/23/2019     Assessment & Plan:   1. Adjustment reaction with depressed mood Shelley Bass continue to struggle with her acute reaction to her recent abortion. She is pleased to be receiving counseling now and she does express hopefulness that she can recover, but continues to struggle with her guilt. I urged her to get out for walks and to try and eat. I reassured her that there is no apparent rash related to her itching and this should resolve. Also, a different class of antidepressant should not  necessarily cause a recurrence of itching, so I urged her to have her Effexor filled and start the medication. I extended her leave of absence from work for an additional 2 weeks. She should follow-up with her PCP or me at that time.    Shelley Mast, MD

## 2020-10-15 NOTE — Telephone Encounter (Signed)
Extended work note faxed to [employer] @ [phone].  Dm/cma

## 2020-10-29 ENCOUNTER — Other Ambulatory Visit: Payer: Self-pay

## 2020-10-29 ENCOUNTER — Ambulatory Visit: Payer: BC Managed Care – PPO | Admitting: Family Medicine

## 2020-10-29 ENCOUNTER — Telehealth: Payer: Self-pay

## 2020-10-29 ENCOUNTER — Encounter: Payer: Self-pay | Admitting: Family Medicine

## 2020-10-29 VITALS — BP 118/70 | HR 78 | Temp 97.6°F | Ht 64.0 in | Wt 160.4 lb

## 2020-10-29 DIAGNOSIS — F4321 Adjustment disorder with depressed mood: Secondary | ICD-10-CM

## 2020-10-29 MED ORDER — VENLAFAXINE HCL ER 37.5 MG PO CP24: 37.5000 mg | ORAL_CAPSULE | ORAL | 1 refills | Status: DC

## 2020-10-29 NOTE — Progress Notes (Signed)
[facility] [address] [place] [address] Dept: [phone] Dept Fax: [phone]  Office Visit  Subjective:    Patient ID: Shelley Bass, female    DOB: 27-Dec-1985, 35 y.o..   MRN: [id number]  Chief Complaint  Patient presents with  . Follow-up    2 week f/u depression.  She states that the Venlafaxine makes her feel as if "she is floating", and still having some itching.     History of Present Illness:  Patient is in today for reassessment of her adjustment reaction. I saw her initial on 3/28 with acute depressive symptoms related to a recent elective abortion.She was initially on Paxil, but had developed itching, so was switched to Effexor. Additionally, she started counseling with [name], though she missed her last counseling session.  Ms. Lipuma is noting improvement in her mood overall. She has been able to get out of the house, do yard work, go to the [employer] with her daughter, and go to the gym. She feels the Effexor makes her feel a  little "high" at times,. esp. in the morning after she takes it. Later in the day, it seems to wear off and she feels fatigued. She did have one crying spell on Friday. She feels ready to move ahead and put this behind her. She would like to try back to work starting on May 3rd.  Past Medical History: Patient Active Problem List   Diagnosis Date Noted  . Adjustment reaction with depressed mood 10/10/2020  . Low TSH level 06/18/2019   Past Surgical History:  Procedure Laterality Date  . DILATION AND EVACUATION     post TAB for complications  . INDUCED ABORTION     surgical   Family History  Problem Relation Age of Onset  . Arthritis Mother   . Miscarriages / [place] Mother   . Hypertension Father   . Vision loss Maternal Grandmother        cataracts  . Diabetes Paternal Grandmother   . Anesthesia problems Neg Hx   . Hypotension Neg Hx   . Malignant  hyperthermia Neg Hx   . Pseudochol deficiency Neg Hx    Outpatient Medications Prior to Visit  Medication Sig Dispense Refill  . diphenhydrAMINE (BENADRYL) 25 MG tablet Take 25 mg by mouth every 6 (six) hours as needed.    . IBUPROFEN PO Take by mouth.    Marland Kitchen OVER THE COUNTER MEDICATION Nature's Bounty vitamins.    Marland Kitchen venlafaxine XR (EFFEXOR XR) 37.5 MG 24 hr capsule Take 1 capsule (37.5 mg total) by mouth daily with breakfast. 30 capsule 0  . ondansetron (ZOFRAN) 4 MG tablet Take 1 tablet (4 mg total) by mouth every 8 (eight) hours as needed for nausea or vomiting. (Patient not taking: Reported on 10/29/2020) 30 tablet 0   No facility-administered medications prior to visit.   Allergies  Allergen Reactions  . Aspirin Hives  . Paxil [Paroxetine] Itching   Objective:   Today's Vitals   10/29/20 1041  BP: 118/70  Pulse: 78  Temp: 97.6 F (36.4 C)  TempSrc: Temporal  SpO2: 98%  Weight: 160 lb 6.4 oz (72.8 kg)  Height: 5\' 4"  (1.626 m)   Body mass index is 27.53 kg/m.   General: Well developed, well nourished. No acute distress. Psych: Alert and oriented. Normal mood and affect, much brighter than at previous encounters.  Health Maintenance Due  Topic Date Due  . Hepatitis C Screening  Never done  .  TETANUS/TDAP  Never done  . PAP SMEAR-Modifier  10/23/2019     Assessment & Plan:   1. Adjustment reaction with depressed mood Improving. Because of the drunken feeling she has on Effexor, we will try having her take this every other day. I asked her to follow-up with [name] in 4 weeks. I will write her a release to return to work on May 3rd.  - venlafaxine XR (EFFEXOR XR) 37.5 MG 24 hr capsule; Take 1 capsule (37.5 mg total) by mouth every other day.  Dispense: 30 capsule; Refill: 1  Loyola Mast, MD

## 2020-10-29 NOTE — Telephone Encounter (Signed)
RTN to work note faxed to [phone] Attn: Rae Mar.  Dm/cma

## 2020-11-20 ENCOUNTER — Ambulatory Visit: Payer: BC Managed Care – PPO | Admitting: Psychology

## 2020-12-04 ENCOUNTER — Ambulatory Visit: Payer: BC Managed Care – PPO | Admitting: Nurse Practitioner

## 2020-12-05 ENCOUNTER — Ambulatory Visit: Payer: BC Managed Care – PPO | Admitting: Psychology

## 2021-06-13 ENCOUNTER — Ambulatory Visit: Payer: BC Managed Care – PPO | Admitting: Nurse Practitioner

## 2021-06-21 ENCOUNTER — Encounter: Payer: Self-pay | Admitting: Nurse Practitioner

## 2021-06-25 ENCOUNTER — Ambulatory Visit: Payer: BC Managed Care – PPO | Admitting: Family Medicine

## 2021-06-26 ENCOUNTER — Other Ambulatory Visit: Payer: Self-pay

## 2021-06-26 ENCOUNTER — Ambulatory Visit (INDEPENDENT_AMBULATORY_CARE_PROVIDER_SITE_OTHER): Payer: BC Managed Care – PPO | Admitting: Family Medicine

## 2021-06-26 VITALS — BP 118/70 | HR 73 | Temp 97.1°F | Ht 64.0 in | Wt 160.4 lb

## 2021-06-26 DIAGNOSIS — Z3009 Encounter for other general counseling and advice on contraception: Secondary | ICD-10-CM

## 2021-06-26 DIAGNOSIS — F339 Major depressive disorder, recurrent, unspecified: Secondary | ICD-10-CM | POA: Insufficient documentation
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
## 2021-09-04 ENCOUNTER — Other Ambulatory Visit: Payer: Self-pay

## 2021-09-04 ENCOUNTER — Ambulatory Visit (INDEPENDENT_AMBULATORY_CARE_PROVIDER_SITE_OTHER): Payer: BC Managed Care – PPO | Admitting: Nurse Practitioner

## 2021-09-04 ENCOUNTER — Encounter: Payer: Self-pay | Admitting: Nurse Practitioner

## 2021-09-04 VITALS — BP 110/62 | HR 68 | Temp 97.6°F | Ht 64.0 in | Wt 161.0 lb

## 2021-09-04 DIAGNOSIS — F339 Major depressive disorder, recurrent, unspecified: Secondary | ICD-10-CM | POA: Diagnosis not present

## 2021-09-04 DIAGNOSIS — F411 Generalized anxiety disorder: Secondary | ICD-10-CM | POA: Insufficient documentation

## 2021-09-04 MED ORDER — ESCITALOPRAM OXALATE 10 MG PO TABS: 10.0000 mg | ORAL_TABLET | Freq: Every day | ORAL | 5 refills | Status: DC

## 2021-09-04 MED ORDER — TRAZODONE HCL 50 MG PO TABS: 25.0000 mg | ORAL_TABLET | Freq: Every evening | ORAL | 5 refills | Status: DC | PRN

## 2021-09-04 NOTE — Patient Instructions (Addendum)
Have FMLA form faxed to be completed for reduced hours (30hrs per week) x 8weeks. ? ?Use trazodone as needed for sleep. ?Start Lexapro 1/2 tablet once daily for 1 week and then increase to a full tablet once daily continuously  ?We discussed common side effects such as nausea, drowsiness and weight gain.  Also discussed rare but serious side effect of suicide ideation.  She is instructed to discontinue medication go directly to ED if this occurs. Or call 911 or go to [address], [place] ?F/up in 2-4weeks. ? ?You will be contacted to schedule appt with psychology ?Look at psychologytoday.com for other possible psychologist in the area. ? ?Suicidal Feelings: How to Help Yourself ?Suicide is when you end your own life. Suicidal ideation includes expressing thoughts about, or a preoccupation with, ending your own life. There are many things you can do to help yourself feel better when struggling with these feelings. Many services and people are available to support you and others who struggle with similar feelings. ?If you ever feel like you may hurt yourself or others, or have thoughts about taking your own life, get help right away. To get help: ?Go to your nearest emergency department. ?Call your local emergency services (911 in the [place] ?Call the [employer] health and human services helpline (211 in the [place] ?Call or text a suicide hotline to speak with a trained counselor. The following suicide hotlines are available in the [place]: ?1-800-273-TALK (1-203-372-0876 or 988 in the [place] ?1-800-SUICIDE 813-154-6458). ?Text 272-515-5247. This is the Crisis Text Line in the [place] ?731 864 3311. This is a hotline for Spanish speakers. ?602-068-9360. This is a hotline for TTY users. ?1-866-4-U-TREVOR (872) 123-9290). This is a hotline for lesbian, gay, bisexual, transgender, or questioning youth. ?For a list of hotlines in [place], visit  AmenCredit.is.html ?Contact a crisis center or a local suicide prevention center. To find a crisis center or suicide prevention center: ?Call your local hospital, clinic, community service organization, mental health center, social service provider, or health department. Ask for help with connecting to a crisis center. ?For a list of crisis centers in the [place], visit: suicidepreventionlifeline.org ?For a list of crisis centers in [place], visit: suicideprevention.ca ?How to help yourself feel better ? ?Promise yourself that you will not do anything bad or extreme when you have suicidal feelings. Remember the times you have felt hopeful. ?Many people have gotten through suicidal thoughts and feelings, and you can too. ?If you have had these feelings before, remind yourself that you can get through them again. ?Let family, friends, teachers, or counselors know how you are feeling. Do not separate yourself from those who care about you and want to help you. ?Talk with someone every day, even if you do not feel like talking to anyone or being with other people. ?Face-to-face conversation is best to help them understand your feelings. ?Contact a mental health care provider and work with this person regularly. ?Make a safety plan that you can follow during a crisis. ?Include phone numbers of suicide prevention hotlines, mental health professionals, and trusted friends and family members you can call during an emergency. ?Save these numbers on your phone. ?If you are thinking of taking a lot of medicine, give your medicine to someone who can give it to you as prescribed. ?If you are on antidepressants and are concerned you will overdose, tell your health care provider so that he or she can give you safer medicines. ?Try to stick to your routines and follow  a schedule every day. Make self-care a priority. ?Make a list of realistic goals, and cross them off when you  achieve them. Accomplishments can give you a sense of worth. ?Wait until you are feeling better before doing things that you find difficult or unpleasant. ?Do things that you have always enjoyed to take your mind off your feelings. ?Try reading a book, or listening to or playing music. ?Spending time outside, in nature, may help you feel better. ?Follow these instructions at home: ? ?Visit your primary health care provider every year for a physical and a mental health checkup. ?Take over-the-counter and prescription medicines only as told by your health care provider. ?Ask your health care provider about the possible side effects of any medicines you are taking. ?Ask your health care provider about whether suicidal ideation is a possible side effect of any of your medicines. ?Learn about suicidal ideation and what increases the risk for the development of suicidal thoughts. ?Eat a well-balanced diet, and eat regular meals. ?Get plenty of rest. ?Exercise if you are able. Just 30 minutes of exercise each day can help you feel better. ?Keep your living space well lit. ?Do not use alcohol or drugs. Remove these substances from your home. ?General recommendations ?Remove weapons, poisons, knives, and other deadly items from your home. ?Work with a mental health care provider as needed. ?When you are feeling well, write yourself a letter with tips and support that you can read when you are not feeling well. ?Remember that life's difficulties can be sorted out with help. Conditions can be treated, and you can learn behaviors and ways of thinking that will help you. ?Work with your health care provider or counselor to learn ways of coping with your thoughts and feelings. ?Where to find more information ?National Suicide Prevention Lifeline: www.suicidepreventionlifeline.org ?Hopeline: www.hopeline.com ?[employer] for Suicide Prevention: https://www.ayers.com/ ?[employer] (for lesbian, gay, bisexual, transgender, or  questioning youth): www.thetrevorproject.org ?[employer] of Mental Health: https://ramirez-williams.com/ ?Suicide Prevention Resources: FarmerBuys.com.au ?Contact a health care provider if: ?You feel as though you are a burden to others. ?You feel agitated, angry, vengeful, or have extreme mood swings. ?You have withdrawn from family and friends. ?You are frequently using drugs or alcohol. ?Get help right away if: ?You are talking about suicide or wishing to die. ?You start making plans for how to commit suicide. ?You feel that you have no reason to live. ?You start making plans for putting your affairs in order, saying goodbye, or giving your possessions away. ?You feel guilt, shame, or unbearable pain, and it seems like there is no way out. ?You are engaging in risky behaviors that could lead to death. ?If you have any of these thoughts or symptoms, get help right away: ?Go to your nearest emergency department or crisis center. ?Call emergency services (911 in the [place] ?Call or text a suicide crisis helpline. ?Summary ?Suicide is when you take your own life. Suicidal feelings are thoughts about ending your own life. ?Promise yourself that you will not do anything bad or extreme when you have suicidal feelings. ?Let family, friends, teachers, or counselors know how you are feeling. ?Get help right away if you start making plans for how to commit suicide. ?This information is not intended to replace advice given to you by your health care provider. Make sure you discuss any questions you have with your health care provider. ?Document Revised: 01/17/2021 Document Reviewed: 11/01/2020 ?Elsevier Patient Education ? 2022 Elsevier Inc. ? ?  ?

## 2021-09-04 NOTE — Progress Notes (Signed)
? ?Subjective:  ?Patient ID: Shelley Bass, female    DOB: 25-Dec-1985  Age: 36 y.o. MRN: [id number] ? ?CC: Acute Visit (Pt c/o panic attacks x3 weeks. Pt states she has been breaking out in hives following the attack. Pt states she feels the attacks are due to severe stress from school, work, and home life. States she is having issues focusing and sleeping also. ) ? ?HPI ? ?Depression, recurrent (HCC) ?Ongoing anxiety and depression, worsening since 09/2020. releated with work stress and conflict in personal relationship. ?This interfers with her sleep, performance at home and relation with her family. ?No therapy sessions. She does not want to use EAP due to lack of trust in confidentiality ?Unable to tolerate paxil and effexor. ?She has thoughts of hopelessness, worthlessness, and death. She states she has no intention to hurt herself nor anyone. She has no hx of suicide attempt or inpatient admission. ? ?She is able to provide verbal safety contract ?Use trazodone as needed for sleep. ?Start Lexapro 1/2 tablet once daily for 1 week and then increase to a full tablet once daily continuously.We discussed common side effects such as nausea, drowsiness and weight gain.  Also discussed rare but serious side effect of suicide ideation.  She is instructed to discontinue medication go directly to ED if this occurs. Or call 911 or go to [address], [place] ?F/up in 2-4weeks. ? ?Depression screen [facility] 2/9 09/04/2021 06/26/2021 04/15/2019  ?Decreased Interest 3 3 0  ?Down, Depressed, Hopeless 3 2 0  ?PHQ - 2 Score 6 5 0  ?Altered sleeping 3 2 -  ?Tired, decreased energy 3 3 -  ?Change in appetite 3 3 -  ?Feeling bad or failure about yourself  3 3 -  ?Trouble concentrating 3 3 -  ?Moving slowly or fidgety/restless 3 0 -  ?Suicidal thoughts 3 1 -  ?PHQ-9 Score 27 20 -  ?Difficult doing work/chores Very difficult Very difficult -  ?  ?GAD 7 : Generalized Anxiety Score 09/04/2021 06/26/2021  ?Nervous, Anxious, on Edge 3 2   ?Control/stop worrying 3 2  ?Worry too much - different things 3 2  ?Trouble relaxing 3 2  ?Restless 3 2  ?Easily annoyed or irritable 3 2  ?Afraid - awful might happen 3 2  ?Total GAD 7 Score 21 14  ?Anxiety Difficulty Very difficult Somewhat difficult  ? ?Reviewed past Medical, Social and Family history today. ? ?Outpatient Medications Prior to Visit  ?Medication Sig Dispense Refill  ? OVER THE COUNTER MEDICATION Nature's Bounty vitamins.    ? IBUPROFEN PO Take by mouth.    ? diphenhydrAMINE (BENADRYL) 25 MG tablet Take 25 mg by mouth every 6 (six) hours as needed. (Patient not taking: Reported on 09/04/2021)    ? ?No facility-administered medications prior to visit.  ? ? ?ROS ?See HPI ? ?Objective:  ?BP 110/62 (BP Location: Left Arm, Patient Position: Sitting, Cuff Size: Large)   Pulse 68   Temp 97.6 ?F (36.4 ?C) (Temporal)   Ht 5\' 4"  (1.626 m)   Wt 161 lb (73 kg)   SpO2 98%   BMI 27.64 kg/m?  ? ?Physical Exam ?Vitals reviewed.  ?Psychiatric:     ?   Attention and Perception: Attention normal.     ?   Mood and Affect: Mood is anxious. Affect is tearful.     ?   Speech: Speech normal.     ?   Behavior: Behavior is cooperative.     ?   Thought  Content: Thought content normal.     ?   Cognition and Memory: Cognition normal.     ?   Judgment: Judgment normal.  ? ?Assessment & Plan:  ?This visit occurred during the SARS-CoV-2 public health emergency.  Safety protocols were in place, including screening questions prior to the visit, additional usage of staff PPE, and extensive cleaning of exam room while observing appropriate contact time as indicated for disinfecting solutions.  ? ?Shelley Bass was seen today for acute visit. ? ?Diagnoses and all orders for this visit: ? ?GAD (generalized anxiety disorder) ?-     escitalopram (LEXAPRO) 10 MG tablet; Take 1 tablet (10 mg total) by mouth daily. ?-     traZODone (DESYREL) 50 MG tablet; Take 0.5-1 tablets (25-50 mg total) by mouth at bedtime as needed for sleep. ?-      Ambulatory referral to Psychology ? ?Depression, recurrent (HCC) ?-     escitalopram (LEXAPRO) 10 MG tablet; Take 1 tablet (10 mg total) by mouth daily. ?-     traZODone (DESYREL) 50 MG tablet; Take 0.5-1 tablets (25-50 mg total) by mouth at bedtime as needed for sleep. ?-     Ambulatory referral to Psychology ? ? ?Problem List Items Addressed This Visit   ? ?  ? Other  ? Depression, recurrent (HCC)  ?  Ongoing anxiety and depression, worsening since 09/2020. releated with work stress and conflict in personal relationship. ?This interfers with her sleep, performance at home and relation with her family. ?No therapy sessions. She does not want to use EAP due to lack of trust in confidentiality ?Unable to tolerate paxil and effexor. ?She has thoughts of hopelessness, worthlessness, and death. She states she has no intention to hurt herself nor anyone. She has no hx of suicide attempt or inpatient admission. ? ?She is able to provide verbal safety contract ?Use trazodone as needed for sleep. ?Start Lexapro 1/2 tablet once daily for 1 week and then increase to a full tablet once daily continuously.We discussed common side effects such as nausea, drowsiness and weight gain.  Also discussed rare but serious side effect of suicide ideation.  She is instructed to discontinue medication go directly to ED if this occurs. Or call 911 or go to [address], [place] ?F/up in 2-4weeks. ? ?  ?  ? Relevant Medications  ? escitalopram (LEXAPRO) 10 MG tablet  ? traZODone (DESYREL) 50 MG tablet  ? Other Relevant Orders  ? Ambulatory referral to Psychology  ? GAD (generalized anxiety disorder) - Primary  ? Relevant Medications  ? escitalopram (LEXAPRO) 10 MG tablet  ? traZODone (DESYREL) 50 MG tablet  ? Other Relevant Orders  ? Ambulatory referral to Psychology  ?  ?Follow-up: Return in about 2 weeks (around 09/18/2021) for anxiety and depression . ? ?Alysia Penna, NP ?

## 2021-09-04 NOTE — Assessment & Plan Note (Signed)
Ongoing anxiety and depression, worsening since 09/2020. releated with work stress and conflict in personal relationship. ?This interfers with her sleep, performance at home and relation with her family. ?No therapy sessions. She does not want to use EAP due to lack of trust in confidentiality ?Unable to tolerate paxil and effexor. ?She has thoughts of hopelessness, worthlessness, and death. She states she has no intention to hurt herself nor anyone. She has no hx of suicide attempt or inpatient admission. ? ?She is able to provide verbal safety contract ?Use trazodone as needed for sleep. ?Start Lexapro 1/2 tablet once daily for 1 week and then increase to a full tablet once daily continuously.We discussed common side effects such as nausea, drowsiness and weight gain.  Also discussed rare but serious side effect of suicide ideation.  She is instructed to discontinue medication go directly to ED if this occurs. Or call 911 or go to [address], [place] ?F/up in 2-4weeks. ? ?

## 2021-09-09 ENCOUNTER — Ambulatory Visit: Payer: BC Managed Care – PPO | Admitting: Family Medicine

## 2021-09-09 ENCOUNTER — Ambulatory Visit (INDEPENDENT_AMBULATORY_CARE_PROVIDER_SITE_OTHER): Payer: BC Managed Care – PPO | Admitting: Family Medicine

## 2021-09-09 ENCOUNTER — Other Ambulatory Visit: Payer: Self-pay

## 2021-09-09 VITALS — BP 116/66 | HR 76 | Temp 98.3°F | Ht 64.0 in | Wt 161.6 lb

## 2021-09-09 DIAGNOSIS — F411 Generalized anxiety disorder: Secondary | ICD-10-CM

## 2021-09-09 DIAGNOSIS — F339 Major depressive disorder, recurrent, unspecified: Secondary | ICD-10-CM

## 2021-09-09 DIAGNOSIS — L509 Urticaria, unspecified: Secondary | ICD-10-CM | POA: Diagnosis not present

## 2021-09-09 MED ORDER — HYDROXYZINE PAMOATE 25 MG PO CAPS: 25.0000 mg | ORAL_CAPSULE | Freq: Three times a day (TID) | ORAL | 1 refills | Status: DC | PRN

## 2021-09-09 NOTE — Progress Notes (Signed)
?[facility] ?[facility] ?[address] ?[place] [address] ?Dept: [phone] ?Dept Fax: [phone] ? ?Office Visit ? ?Subjective:  ? ? Patient ID: Shelley Bass, female    DOB: 1985/09/09, 36 y.o..   MRN: [id number] ? ?Chief Complaint  ?Patient presents with  ? Acute Visit  ?  C/o having hives x 6 weeks.  She has been taking benadryl.    ? ? ?History of Present Illness: ? ?Patient is in today with an ongoing complaint of hives. She notes these have been occurring over the past 5 months. They are associated with itching. They occur on the trunk, extremities, and scalp. These have been more intense recently. She has tried using Benadryl, but this does not seem to help. She notes she feels an internal burning sensation. She has the feeling that something is wrong inside her body. ? ?Shelley Bass has a history of recurrent depression and generalized anxiety. She was seen last week by Shelley Bass, her PCP. She felt some of the itching was a response to her poorly managed anxiety/depression. Her PHQ and GAD7 were both quite high. Shelley Bass prescribed Lexapro and trazodone. Shelley Bass notes she did reach out to a therapist. ? ?Past Medical History: ?Patient Active Problem List  ? Diagnosis Date Noted  ? GAD (generalized anxiety disorder) 09/04/2021  ? Depression, recurrent (HCC) 06/26/2021  ? Low TSH level 06/18/2019  ? ?Past Surgical History:  ?Procedure Laterality Date  ? DILATION AND EVACUATION    ? post TAB for complications  ? INDUCED ABORTION    ? surgical  ? ?Family History  ?Problem Relation Age of Onset  ? Arthritis Mother   ? Miscarriages / [place] Mother   ? Hypertension Father   ? Vision loss Maternal Grandmother   ?     cataracts  ? Diabetes Paternal Grandmother   ? Anesthesia problems Neg Hx   ? Hypotension Neg Hx   ? Malignant hyperthermia Neg Hx   ? Pseudochol deficiency Neg Hx   ? ? ?Outpatient Medications Prior to Visit  ?Medication Sig Dispense Refill  ?  escitalopram (LEXAPRO) 10 MG tablet Take 1 tablet (10 mg total) by mouth daily. 30 tablet 5  ? OVER THE COUNTER MEDICATION Nature's Bounty vitamins.    ? traZODone (DESYREL) 50 MG tablet Take 0.5-1 tablets (25-50 mg total) by mouth at bedtime as needed for sleep. 30 tablet 5  ? ?No facility-administered medications prior to visit.  ? ?Allergies  ?Allergen Reactions  ? Aspirin Hives  ? Effexor [Venlafaxine] Itching  ? Paxil [Paroxetine] Itching  ?  ?Objective:  ? ?Today's Vitals  ? 09/09/21 1619  ?BP: 116/66  ?Pulse: 76  ?Temp: 98.3 ?F (36.8 ?C)  ?TempSrc: Temporal  ?SpO2: 96%  ?Weight: 161 lb 9.6 oz (73.3 kg)  ?Height: 5\' 4"  (1.626 m)  ? ?Body mass index is 27.74 kg/m?.  ? ?General: Well developed, well nourished. No acute distress. ?Skin: Warm and dry. Multiple excoriations in several places on her trunk and arms. No visible  ? hives. Patient shared photographs of her rash. Some of these show possible hives and  ? dermatographism. ?Neuro: CN II-XII intact. Normal sensation and DTR bilaterally. ?Psych: Alert and oriented. Normal mood and affect. ? ?Health Maintenance Due  ?Topic Date Due  ? Hepatitis C Screening  Never done  ? TETANUS/TDAP  Never done  ? PAP SMEAR-Modifier  10/23/2019  ?   ?Assessment & Plan:  ? ?1. Hives ?I will try Ms.  Bass on hydroxyzine for the itching. I recommend she wear light, cotton clothing to reduce itching. I will refer her to an alelrgist. I amconcerned that there may be an anxiety component at play. I did reassure her that hives are not indicative of a life-threatening issue. ? ?- hydrOXYzine (VISTARIL) 25 MG capsule; Take 1 capsule (25 mg total) by mouth every 8 (eight) hours as needed.  Dispense: 30 capsule; Refill: 1 ?- Ambulatory referral to Allergy ? ?2. GAD (generalized anxiety disorder) ?3. Depression, recurrent (HCC) ?On Lexapro and receiving counseling. I strongly urge her to follow through with this. ? ?Return for As scheduled with Shelley Bass.  ? ?Loyola Mast, MD ?

## 2021-09-09 NOTE — Patient Instructions (Signed)
Hives °Hives (urticaria) are itchy, red, swollen areas on the skin. Hives can appear on any part of the body. Hives often fade within 24 hours (acute hives). Sometimes, new hives appear after old ones fade and the cycle can continue for several days or weeks (chronic hives). Hives do not spread from person to person (are not contagious). °Hives come from the body's reaction to something a person is allergic to (allergen), something that causes irritation, or various other triggers. When a person is exposed to a trigger, his or her body releases a chemical (histamine) that causes redness, itching, and swelling. Hives can appear right after exposure to a trigger or hours later. °What are the causes? °This condition may be caused by: °Allergies to foods or ingredients. °Insect bites or stings. °Exposure to pollen or pets. °Spending time in sunlight, heat, or cold (exposure). °Exercise. °Stress. °You can also get hives from other medical conditions and treatments, such as: °Viruses, including the common cold. °Bacterial infections, such as urinary tract infections and strep throat. °Certain medicines. °Contact with latex or chemicals. °Allergy shots. °Blood transfusions. °Sometimes, the cause of this condition is not known (idiopathic hives). °What increases the risk? °You are more likely to develop this condition if you: °Are a woman. °Have food allergies, especially to citrus fruits, milk, eggs, peanuts, tree nuts, or shellfish. °Are allergic to: °Medicines. °Latex. °Insects. °Animals. °Pollen. °What are the signs or symptoms? °Common symptoms of this condition include raised, itchy, red or white bumps or patches on your skin. These areas may: °Become large and swollen (welts). °Change in shape and location, quickly and repeatedly. °Be separate hives or connect over a large area of skin. °Sting or become painful. °Turn white when pressed in the center (blanch). °In severe cases, your hands, feet, and face may also  become swollen. This may occur if hives develop deeper in your skin. °How is this diagnosed? °This condition may be diagnosed by your symptoms, medical history, and physical exam. °Your skin, urine, or blood may be tested to find out what is causing your hives and to rule out other health issues. °Your health care provider may also remove a small sample of skin from the affected area and examine it under a microscope (biopsy). °How is this treated? °Treatment for this condition depends on the cause and severity of your symptoms. Your health care provider may recommend using cool, wet cloths (cool compresses) or taking cool showers to relieve itching. Treatment may include: °Medicines that help: °Relieve itching (antihistamines). °Reduce swelling (corticosteroids). °Treat infection (antibiotics). °An injectable medicine (omalizumab). Your health care provider may prescribe this if you have chronic idiopathic hives and you continue to have symptoms even after treatment with antihistamines. °Severe cases may require an emergency injection of adrenaline (epinephrine) to prevent a life-threatening allergic reaction (anaphylaxis). °Follow these instructions at home: °Medicines °Take and apply over-the-counter and prescription medicines only as told by your health care provider. °If you were prescribed an antibiotic medicine, take it as told by your health care provider. Do not stop using the antibiotic even if you start to feel better. °Skin care °Apply cool compresses to the affected areas. °Do not scratch or rub your skin. °General instructions °Do not take hot showers or baths. This can make itching worse. °Do not wear tight-fitting clothing. °Use sunscreen and wear protective clothing when you are outside. °Avoid any substances that cause your hives. Keep a journal to help track what causes your hives. Write down: °What medicines you take. °  What you eat and drink. °What products you use on your skin. °Keep all  follow-up visits as told by your health care provider. This is important. °Contact a health care provider if: °Your symptoms are not controlled with medicine. °Your joints are painful or swollen. °Get help right away if: °You have a fever. °You have pain in your abdomen. °Your tongue or lips are swollen. °Your eyelids are swollen. °Your chest or throat feels tight. °You have trouble breathing or swallowing. °These symptoms may represent a serious problem that is an emergency. Do not wait to see if the symptoms will go away. Get medical help right away. Call your local emergency services (911 in the [place] Do not drive yourself to the hospital. °Summary °Hives (urticaria) are itchy, red, swollen areas on your skin. Hives come from the body's reaction to something a person is allergic to (allergen), something that causes irritation, or various other triggers. °Treatment for this condition depends on the cause and severity of your symptoms. °Avoid any substances that cause your hives. Keep a journal to help track what causes your hives. °Take and apply over-the-counter and prescription medicines only as told by your health care provider. °Get help right away if your chest or throat feels tight or if you have trouble breathing or swallowing. °This information is not intended to replace advice given to you by your health care provider. Make sure you discuss any questions you have with your health care provider. °Document Revised: 08/12/2020 Document Reviewed: 08/12/2020 °Elsevier Patient Education © 2022 [employer] °

## 2021-09-11 ENCOUNTER — Ambulatory Visit: Payer: Self-pay | Admitting: Allergy & Immunology

## 2021-09-24 ENCOUNTER — Telehealth: Payer: Self-pay | Admitting: Nurse Practitioner

## 2021-09-24 ENCOUNTER — Ambulatory Visit: Payer: BC Managed Care – PPO | Admitting: Nurse Practitioner

## 2021-09-24 NOTE — Telephone Encounter (Signed)
Patient/Caregiver was notified of No Show/Late Cancellation Policy & possible $50 charge. ?Visit was cancelled with reason "No Show/Cancel within 24 hours" for tracking & charging. ? ?Caller Name: Iva Montelongo  ?Caller Ph #: [phone] ?Date of APPT: 09/24/21 ?Reason given for no show/late cancellation: no reason no show ?No Show Letter printed & put in outgoing mail (Yes/No): yes ? ?~~~Route message to admin supervisor and clinical team/CMA~~~ ? ? ? ?

## 2021-10-03 NOTE — Telephone Encounter (Signed)
2nd no show, fee generated, letter sent KO ?

## 2022-04-16 ENCOUNTER — Ambulatory Visit (INDEPENDENT_AMBULATORY_CARE_PROVIDER_SITE_OTHER): Payer: BC Managed Care – PPO | Admitting: Nurse Practitioner

## 2022-04-16 ENCOUNTER — Encounter: Payer: Self-pay | Admitting: Nurse Practitioner

## 2022-04-16 VITALS — BP 110/68 | HR 73 | Temp 98.2°F | Ht 64.0 in | Wt 166.6 lb

## 2022-04-16 DIAGNOSIS — R7989 Other specified abnormal findings of blood chemistry: Secondary | ICD-10-CM | POA: Diagnosis not present

## 2022-04-16 DIAGNOSIS — L509 Urticaria, unspecified: Secondary | ICD-10-CM | POA: Insufficient documentation

## 2022-04-16 DIAGNOSIS — R208 Other disturbances of skin sensation: Secondary | ICD-10-CM

## 2022-04-16 DIAGNOSIS — E663 Overweight: Secondary | ICD-10-CM | POA: Diagnosis not present

## 2022-04-16 LAB — CBC WITH DIFFERENTIAL/PLATELET
Basophils Absolute: 0 10*3/uL (ref 0.0–0.1)
Basophils Relative: 0.4 % (ref 0.0–3.0)
Eosinophils Absolute: 0 10*3/uL (ref 0.0–0.7)
Eosinophils Relative: 1.2 % (ref 0.0–5.0)
HCT: 36 % (ref 36.0–46.0)
Hemoglobin: 12.2 g/dL (ref 12.0–15.0)
Lymphocytes Relative: 44.1 % (ref 12.0–46.0)
Lymphs Abs: 1.8 10*3/uL (ref 0.7–4.0)
MCHC: 33.8 g/dL (ref 30.0–36.0)
MCV: 92.4 fl (ref 78.0–100.0)
Monocytes Absolute: 0.3 10*3/uL (ref 0.1–1.0)
Monocytes Relative: 6.8 % (ref 3.0–12.0)
Neutro Abs: 1.9 10*3/uL (ref 1.4–7.7)
Neutrophils Relative %: 47.5 % (ref 43.0–77.0)
Platelets: 175 10*3/uL (ref 150.0–400.0)
RBC: 3.89 Mil/uL (ref 3.87–5.11)
RDW: 13.2 % (ref 11.5–15.5)
WBC: 4.1 10*3/uL (ref 4.0–10.5)

## 2022-04-16 LAB — COMPREHENSIVE METABOLIC PANEL
ALT: 37 U/L — ABNORMAL HIGH (ref 0–35)
AST: 26 U/L (ref 0–37)
Albumin: 4.3 g/dL (ref 3.5–5.2)
Alkaline Phosphatase: 43 U/L (ref 39–117)
BUN: 8 mg/dL (ref 6–23)
CO2: 28 mEq/L (ref 19–32)
Calcium: 9.5 mg/dL (ref 8.4–10.5)
Chloride: 101 mEq/L (ref 96–112)
Creatinine, Ser: 0.87 mg/dL (ref 0.40–1.20)
GFR: 86.13 mL/min (ref >60.00)
Glucose, Bld: 95 mg/dL (ref 70–99)
Potassium: 3.6 mEq/L (ref 3.5–5.1)
Sodium: 136 mEq/L (ref 135–145)
Total Bilirubin: 0.8 mg/dL (ref 0.2–1.2)
Total Protein: 8 g/dL (ref 6.0–8.3)

## 2022-04-16 LAB — VITAMIN B12: Vitamin B-12: 363 pg/mL (ref 211–911)

## 2022-04-16 LAB — HEMOGLOBIN A1C: Hgb A1c MFr Bld: 5.9 % (ref 4.6–6.5)

## 2022-04-16 LAB — TSH: TSH: 0.2 u[IU]/mL — ABNORMAL LOW (ref 0.35–5.50)

## 2022-04-16 MED ORDER — FAMOTIDINE 20 MG PO TABS: 20.0000 mg | ORAL_TABLET | Freq: Every day | ORAL | 1 refills | Status: DC

## 2022-04-16 MED ORDER — HYDROXYZINE PAMOATE 25 MG PO CAPS: 25.0000 mg | ORAL_CAPSULE | Freq: Three times a day (TID) | ORAL | 1 refills | Status: DC | PRN

## 2022-04-16 MED ORDER — LORATADINE 10 MG PO TABS: 10.0000 mg | ORAL_TABLET | Freq: Every day | ORAL | 11 refills | Status: DC

## 2022-04-16 NOTE — Progress Notes (Signed)
Acute Office Visit  Subjective:     Patient ID: Shelley Bass, female    DOB: 1985/12/25, 36 y.o.   MRN: [id number]  Chief Complaint  Patient presents with   Acute Visit    Pt c/o allergic reaction x 7 months states that it feel like a burning sanitation throughout whole body. Pt feels anxiety is not as high    HPI Patient is in today for ongoing intermittent hives for the last 7 months. She has not noticed any recent changes in detergent, food, or soaps. She has also noticed a burning inside her body sometimes. She states that her anxiety is not as high and she is still breaking out. She endorses itching. She has been taking hydroxyzine, however that makes her sleepy. She denies shortness of breath and chest pain.   She states that she gained weight recently and is not sure if this has anything to do with her hives. She is interested in talking with a weight loss specialist.   ROS See pertinent positives and negatives per HPI.     Objective:    BP 110/68 (BP Location: Right Arm, Patient Position: Sitting, Cuff Size: Normal)   Pulse 73   Temp 98.2 F (36.8 C)   Ht 5\' 4"  (1.626 m)   Wt 166 lb 9.6 oz (75.6 kg)   SpO2 97%   BMI 28.60 kg/m    Physical Exam Vitals and nursing note reviewed.  Constitutional:      General: She is not in acute distress.    Appearance: Normal appearance.  HENT:     Head: Normocephalic.  Eyes:     Conjunctiva/sclera: Conjunctivae normal.  Cardiovascular:     Rate and Rhythm: Normal rate and regular rhythm.     Pulses: Normal pulses.     Heart sounds: Normal heart sounds.  Pulmonary:     Effort: Pulmonary effort is normal.     Breath sounds: Normal breath sounds.  Musculoskeletal:     Cervical back: Normal range of motion.  Skin:    General: Skin is warm.     Findings: Rash present.     Comments: Raised wheels to posterior right leg  Neurological:     General: No focal deficit present.     Mental Status: She is alert and oriented  to person, place, and time.  Psychiatric:        Mood and Affect: Mood normal.        Behavior: Behavior normal.        Thought Content: Thought content normal.        Judgment: Judgment normal.       Assessment & Plan:   Problem List Items Addressed This Visit       Musculoskeletal and Integument   Hives - Primary    Chronic, ongoing.  She has been having hives for the last 7 months.  She has not identified any triggers at this point.  We will have her start loratadine 10 mg daily, Pepcid 20 mg daily and continue hydroxyzine as needed for itching.  We will place a referral to an allergist.  Follow-up with PCP in 2 to 3 weeks.      Relevant Medications   hydrOXYzine (VISTARIL) 25 MG capsule   Other Relevant Orders   Amb ref to Medical Nutrition Therapy-MNT   Ambulatory referral to Allergy   CBC with Differential/Platelet     Other   Low TSH level    History of low TSH, will  recheck levels again.       Relevant Orders   TSH   Overweight    BMI 28.6. Will place referral to nutritionist per patient request.       Other Visit Diagnoses     Burning sensation       Burning sensation throughout body. Will check A1c, cmp, cbc, tsh, and vitamin B12.    Relevant Orders   CBC with Differential/Platelet   Comprehensive metabolic panel   Vitamin B12   Hemoglobin A1c       Meds ordered this encounter  Medications   hydrOXYzine (VISTARIL) 25 MG capsule    Sig: Take 1 capsule (25 mg total) by mouth every 8 (eight) hours as needed.    Dispense:  30 capsule    Refill:  1   loratadine (CLARITIN) 10 MG tablet    Sig: Take 1 tablet (10 mg total) by mouth daily.    Dispense:  30 tablet    Refill:  11   famotidine (PEPCID) 20 MG tablet    Sig: Take 1 tablet (20 mg total) by mouth daily.    Dispense:  30 tablet    Refill:  1    Return in about 3 weeks (around 05/07/2022) for 2-3 weeks with PCP for hives.  Gerre Scull, NP

## 2022-04-16 NOTE — Assessment & Plan Note (Signed)
BMI 28.6. Will place referral to nutritionist per patient request.

## 2022-04-16 NOTE — Assessment & Plan Note (Signed)
History of low TSH, will recheck levels again.

## 2022-04-16 NOTE — Assessment & Plan Note (Signed)
Chronic, ongoing.  She has been having hives for the last 7 months.  She has not identified any triggers at this point.  We will have her start loratadine 10 mg daily, Pepcid 20 mg daily and continue hydroxyzine as needed for itching.  We will place a referral to an allergist.  Follow-up with PCP in 2 to 3 weeks.

## 2022-04-16 NOTE — Patient Instructions (Signed)
It was great to see you!  Start loratadine (claritin) 1 tablet every morning and pepcid 1 tablet daily to help with your hives. I have also refilled the hydroxyzine to take as needed for itching.   We are checking your labs today and will let you know the results via mychart/phone.   I have referred you to a nutritionist and an allergist. They will call to schedule an appointment.   Let's follow-up 2-3 weeks with PCP, sooner with concerns.   Take care,  Vance Peper, NP

## 2022-04-17 ENCOUNTER — Other Ambulatory Visit (INDEPENDENT_AMBULATORY_CARE_PROVIDER_SITE_OTHER): Payer: BC Managed Care – PPO

## 2022-04-17 DIAGNOSIS — R7989 Other specified abnormal findings of blood chemistry: Secondary | ICD-10-CM

## 2022-04-17 LAB — T4, FREE: Free T4: 0.94 ng/dL (ref 0.60–1.60)

## 2022-04-17 NOTE — Addendum Note (Signed)
Addended by: Vance Peper A on: 04/17/2022 08:42 AM   Modules accepted: Orders

## 2022-04-28 ENCOUNTER — Ambulatory Visit: Payer: BC Managed Care – PPO | Admitting: Dietician

## 2022-04-30 ENCOUNTER — Encounter: Payer: BC Managed Care – PPO | Attending: Nurse Practitioner | Admitting: Skilled Nursing Facility1

## 2022-04-30 ENCOUNTER — Encounter: Payer: Self-pay | Admitting: Skilled Nursing Facility1

## 2022-04-30 DIAGNOSIS — R7309 Other abnormal glucose: Secondary | ICD-10-CM | POA: Insufficient documentation

## 2022-04-30 DIAGNOSIS — L509 Urticaria, unspecified: Secondary | ICD-10-CM | POA: Insufficient documentation

## 2022-04-30 DIAGNOSIS — Z713 Dietary counseling and surveillance: Secondary | ICD-10-CM | POA: Diagnosis not present

## 2022-04-30 DIAGNOSIS — E663 Overweight: Secondary | ICD-10-CM | POA: Insufficient documentation

## 2022-04-30 NOTE — Progress Notes (Signed)
Medical Nutrition Therapy  Appointment Start time:  7:57  Appointment End time:  8:30  Primary concerns today: increased A1C  Referral diagnosis: overweight Preferred learning style: auditory, visual Learning readiness: contemplating   NUTRITION ASSESSMENT   Body Composition Scale 04/30/2022  Current Body Weight 162.8  Total Body Fat % 32.3  Visceral Fat 7  Fat-Free Mass % 67.6   Total Body Water % 48.3  Muscle-Mass lbs 31  BMI 27.7  Body Fat Displacement          Torso  lbs 32.4         Left Leg  lbs 6.4         Right Leg  lbs 6.4         Left Arm  lbs 3.2         Right Arm   lbs 3.2    Clinical Medical Hx: GDM Medications: see list Labs: A1C 5.9 Notable Signs/Symptoms: tiredness  Lifestyle & Dietary Hx  Pt arrived 30 minutes late so it was an abbreviated appt.   Pt states she was worried about hives and went to the doctor and found her A1C increased so her main concern is the A1C in being increased.  Pt states she feels tired all the time.    Estimated daily fluid intake: oz Supplements:  Sleep:  Stress / self-care: high stress reducing self care Current average weekly physical activity: ADL's  24-Hr Dietary Recall First Meal: skipped Snack:  Second Meal: chic fila nuggets and salad + deli meat + cheese  Snack: reeses  Third Meal: chicken and rice and broccoli or lasagna and salad or ribs cabbage and sweet potato  Snack:  Beverages: coffee + creamer, soda, water    NUTRITION INTERVENTION  Nutrition education (E-1) on the following topics:  A1C and Dx of prediabetes and DM Physical activity and its importance in A1C control and mental health   Learning Style & Readiness for Change Teaching method utilized: Visual & Auditory  Demonstrated degree of understanding via: Teach Back  Barriers to learning/adherence to lifestyle change: high stress level  Goals Established by Pt Go for a walk by yourself 2 days a week Eat breakfast   MONITORING &  EVALUATION Dietary intake, weekly physical activity  Next Steps  Patient is to follow up in 2 months.

## 2022-06-25 ENCOUNTER — Ambulatory Visit: Payer: BC Managed Care – PPO | Admitting: Skilled Nursing Facility1

## 2023-03-31 ENCOUNTER — Encounter: Payer: Self-pay | Admitting: Nurse Practitioner

## 2023-03-31 ENCOUNTER — Ambulatory Visit (INDEPENDENT_AMBULATORY_CARE_PROVIDER_SITE_OTHER): Payer: BC Managed Care – PPO | Admitting: Nurse Practitioner

## 2023-03-31 ENCOUNTER — Other Ambulatory Visit: Payer: Self-pay | Admitting: Family Medicine

## 2023-03-31 VITALS — BP 118/84 | HR 68 | Temp 98.4°F | Ht 64.0 in | Wt 154.8 lb

## 2023-03-31 DIAGNOSIS — F5102 Adjustment insomnia: Secondary | ICD-10-CM | POA: Insufficient documentation

## 2023-03-31 DIAGNOSIS — R7301 Impaired fasting glucose: Secondary | ICD-10-CM | POA: Insufficient documentation

## 2023-03-31 DIAGNOSIS — F339 Major depressive disorder, recurrent, unspecified: Secondary | ICD-10-CM

## 2023-03-31 DIAGNOSIS — L509 Urticaria, unspecified: Secondary | ICD-10-CM

## 2023-03-31 DIAGNOSIS — E663 Overweight: Secondary | ICD-10-CM

## 2023-03-31 DIAGNOSIS — R7989 Other specified abnormal findings of blood chemistry: Secondary | ICD-10-CM

## 2023-03-31 DIAGNOSIS — F411 Generalized anxiety disorder: Secondary | ICD-10-CM

## 2023-03-31 MED ORDER — LORATADINE 10 MG PO TABS: 10.0000 mg | ORAL_TABLET | Freq: Every day | ORAL | 0 refills | Status: AC

## 2023-03-31 MED ORDER — FAMOTIDINE 20 MG PO TABS: 20.0000 mg | ORAL_TABLET | Freq: Two times a day (BID) | ORAL | 0 refills | Status: AC

## 2023-03-31 MED ORDER — METHYLPREDNISOLONE ACETATE 40 MG/ML IJ SUSP: 40.0000 mg | Freq: Once | INTRAMUSCULAR | Status: DC

## 2023-03-31 MED ORDER — METHYLPREDNISOLONE SODIUM SUCC 40 MG IJ SOLR
40.0000 mg | Freq: Once | INTRAMUSCULAR | Status: AC
  Administered 2023-03-31: 40 mg via INTRAMUSCULAR

## 2023-03-31 MED ORDER — TRAZODONE HCL 50 MG PO TABS: 25.0000 mg | ORAL_TABLET | Freq: Every evening | ORAL | 5 refills | Status: AC | PRN

## 2023-03-31 NOTE — Progress Notes (Signed)
Established Patient Visit  Patient: Shelley Bass   DOB: 04/04/1986   37 y.o. Female  MRN: [id number] Visit Date: 03/31/2023  Subjective:    Chief Complaint  Patient presents with   Hives with itching    For 1 month   HPI Low TSH level She states thyroid panel was completed by Integr[facility] provider 3months ago. Advised to bring copy of lab results  GAD (generalized anxiety disorder) Waxing and waning Denies need for lexapro 10mg . She d/c med >51months ago She plans to schedule appointment with psychologist  Depression, recurrent [facility]) She d/c lexapro 10mg >73months ago She opted to schedule appointment with psychologist  Hives Generalized hives with itching, Acute on chronic, waxing and waning. No hoarseness or cough or mouth/throat/facial swelling  Unknown trigger Reports she stopped eating seafood x 1week, but still have hives. Did not schedule appointment with allergist as recommended last year as recommended. Some improvement with Claritin and famotidine, but has been out of meds No improvement with vistaril-drowsiness  Advised to schedule appointment with allergist, use hypoallergenic skin products and detergent, keep a food journal. Resume Claritin in PM and famotidine 20mg  BID, she requested rx sent due to OVER THE COUNTER cost.   Overweight Started wegovy injection 12/2022, prescribed by [facility] Provider Also had labs completed 12/2022: records requested Wt Readings from Last 3 Encounters:  03/31/23 154 lb 12.8 oz (70.2 kg)  04/30/22 162 lb 12.8 oz (73.8 kg)  04/16/22 166 lb 9.6 oz (75.6 kg)     Adjustment insomnia Use of trazodone prn Prescription sent  Impaired fasting glucose Last hgbA1c at 5.9% She reports hgbA1c was completed 12/2022 by integrative medicine provider. Advised to have results faxed to me  Wt Readings from Last 3 Encounters:  03/31/23 154 lb 12.8 oz (70.2 kg)  04/30/22 162 lb 12.8 oz (73.8 kg)   04/16/22 166 lb 9.6 oz (75.6 kg)    Reviewed medical, surgical, and social history today  Medications: Outpatient Medications Prior to Visit  Medication Sig   Semaglutide-Weight Management 0.25 MG/0.5ML SOAJ Inject 0.25 mg into the skin once a week.   [DISCONTINUED] loratadine (CLARITIN) 10 MG tablet Take 1 tablet (10 mg total) by mouth daily.   [DISCONTINUED] escitalopram (LEXAPRO) 10 MG tablet Take 1 tablet (10 mg total) by mouth daily. (Patient not taking: Reported on 04/16/2022)   [DISCONTINUED] famotidine (PEPCID) 20 MG tablet Take 1 tablet (20 mg total) by mouth daily. (Patient not taking: Reported on 03/31/2023)   [DISCONTINUED] hydrOXYzine (VISTARIL) 25 MG capsule Take 1 capsule (25 mg total) by mouth every 8 (eight) hours as needed. (Patient not taking: Reported on 03/31/2023)   [DISCONTINUED] ibuprofen (ADVIL) 800 MG tablet Take by mouth. (Patient not taking: Reported on 03/31/2023)   [DISCONTINUED] OVER THE COUNTER MEDICATION Nature[employer] vitamins. (Patient not taking: Reported on 03/31/2023)   [DISCONTINUED] traZODone (DESYREL) 50 MG tablet Take 0.5-1 tablets (25-50 mg total) by mouth at bedtime as needed for sleep. (Patient not taking: Reported on 03/31/2023)   No facility-administered medications prior to visit.   Reviewed past medical and social history.   ROS per HPI above  Last CBC Lab Results  Component Value Date   WBC 4.1 04/16/2022   HGB 12.2 04/16/2022   HCT 36.0 04/16/2022   MCV 92.4 04/16/2022   MCH 28.5 03/06/2017   RDW 13.2 04/16/2022   PLT 175.0 04/16/2022   Last metabolic panel Lab Results  Component Value Date   GLUCOSE 95 04/16/2022   NA 136 04/16/2022   K 3.6 04/16/2022   CL 101 04/16/2022   CO2 28 04/16/2022   BUN 8 04/16/2022   CREATININE 0.87 04/16/2022   GFR 86.13 04/16/2022   CALCIUM 9.5 04/16/2022   PROT 8.0 04/16/2022   ALBUMIN 4.3 04/16/2022   BILITOT 0.8 04/16/2022   ALKPHOS 43 04/16/2022   AST 26 04/16/2022   ALT 37 (H)  04/16/2022   ANIONGAP 8 08/04/2016   Last thyroid functions Lab Results  Component Value Date   TSH 0.20 (L) 04/16/2022        Objective:  BP 118/84 (BP Location: Right Arm)   Pulse 68   Temp 98.4 F (36.9 C)   Ht 5\' 4"  (1.626 m)   Wt 154 lb 12.8 oz (70.2 kg)   LMP 03/18/2023 (Exact Date)   SpO2 97%   BMI 26.57 kg/m      Physical Exam Vitals and nursing note reviewed.  Eyes:     Extraocular Movements: Extraocular movements intact.     Conjunctiva/sclera: Conjunctivae normal.  Cardiovascular:     Rate and Rhythm: Normal rate.     Pulses: Normal pulses.  Pulmonary:     Effort: Pulmonary effort is normal.  Musculoskeletal:        General: Normal range of motion.     Cervical back: Normal range of motion and neck supple.  Lymphadenopathy:     Cervical: No cervical adenopathy.  Skin:    Findings: Rash present. Rash is urticarial.     Comments: Generalized urticaria  Neurological:     Mental Status: She is alert and oriented to person, place, and time.     No results found for any visits on 03/31/23.    Assessment & Plan:    Problem List Items Addressed This Visit     Adjustment insomnia    Use of trazodone prn Prescription sent      Relevant Medications   traZODone (DESYREL) 50 MG tablet   Depression, recurrent (HCC)    She d/c lexapro 10mg >53months ago She opted to schedule appointment with psychologist      Relevant Medications   traZODone (DESYREL) 50 MG tablet   GAD (generalized anxiety disorder)    Waxing and waning Denies need for lexapro 10mg . She d/c med >23months ago She plans to schedule appointment with psychologist      Relevant Medications   traZODone (DESYREL) 50 MG tablet   Hives - Primary    Generalized hives with itching, Acute on chronic, waxing and waning. No hoarseness or cough or mouth/throat/facial swelling  Unknown trigger Reports she stopped eating seafood x 1week, but still have hives. Did not schedule appointment with  allergist as recommended last year as recommended. Some improvement with Claritin and famotidine, but has been out of meds No improvement with vistaril-drowsiness  Advised to schedule appointment with allergist, use hypoallergenic skin products and detergent, keep a food journal. Resume Claritin in PM and famotidine 20mg  BID, she requested rx sent due to OVER THE COUNTER cost.       Relevant Medications   loratadine (CLARITIN) 10 MG tablet   famotidine (PEPCID) 20 MG tablet   Other Relevant Orders   Ambulatory referral to Allergy   Impaired fasting glucose    Last hgbA1c at 5.9% She reports hgbA1c was completed 12/2022 by integrative medicine provider. Advised to have results faxed to me      Low TSH level  She states thyroid panel was completed by [facility] provider 3months ago. Advised to bring copy of lab results      Overweight    Started wegovy injection 12/2022, prescribed by [facility] Provider Also had labs completed 12/2022: records requested Wt Readings from Last 3 Encounters:  03/31/23 154 lb 12.8 oz (70.2 kg)  04/30/22 162 lb 12.8 oz (73.8 kg)  04/16/22 166 lb 9.6 oz (75.6 kg)         Return in about 3 months (around 06/30/2023) for CPE (fasting).     Alysia Penna, NP

## 2023-03-31 NOTE — Assessment & Plan Note (Signed)
Generalized hives with itching, Acute on chronic, waxing and waning. No hoarseness or cough or mouth/throat/facial swelling  Unknown trigger Reports she stopped eating seafood x 1week, but still have hives. Did not schedule appointment with allergist as recommended last year as recommended. Some improvement with Claritin and famotidine, but has been out of meds No improvement with vistaril-drowsiness  Advised to schedule appointment with allergist, use hypoallergenic skin products and detergent, keep a food journal. Resume Claritin in PM and famotidine 20mg  BID, she requested rx sent due to OVER THE COUNTER cost.

## 2023-03-31 NOTE — Patient Instructions (Addendum)
Schedule appointment with allergy specialist [phone]  [phone] and psychology. Send copy of recent lab results  Hives Hives (urticaria) are itchy, red, swollen areas of skin. They can show up on any part of the body. They often fade within 24 hours (acute hives). If you get new hives after the old ones fade and the cycle goes on for many days or weeks, it is called chronic hives. Hives do not spread from person to person (are not contagious). Hives can happen when your body reacts to something you are allergic to (allergen) or to something that irritates your skin. When you are exposed to something that triggers hives, your body releases a chemical called histamine. This causes redness, itching, and swelling. Hives can show up right after you are exposed to a trigger or hours later. What are the causes? Hives may be caused by: Food allergies. Insect bites or stings. Allergies to pollen or pets. Spending time in sunlight, heat, or cold (exposure). Exercise. Stress. You can also get hives from other conditions and treatments. These include: Viruses, such as the common cold. Bacterial infections, such as urinary tract infections and strep throat. Certain medicines. Contact with latex or chemicals. Allergy shots. Blood transfusions. In some cases, the cause of hives is not known (idiopathic hives). What increases the risk? You are more likely to get hives if: You are female. You have food allergies. Hives are more common if you are allergic to citrus fruits, milk, eggs, peanuts, tree nuts, or shellfish. You are allergic to: Medicines. Latex. Insects. Animals. Pollen. What are the signs or symptoms? Common symptoms of hives include raised, itchy, red or white bumps or patches on your skin. These areas may: Become large and swollen (welts). Quickly change shape and location. This may happen more than once. Be separate hives or connect over a large area of skin. Sting or become  painful. Turn white when pressed in the center (blanch). In severe cases, your hands, feet, and face may also become swollen. This may happen if hives form deeper in your skin. How is this diagnosed? Hives may be diagnosed based on your symptoms, medical history, and a physical exam. You may have skin, pee (urine), or blood tests done. These can help find out what is causing your hives and rule out other health issues. You may also have a biopsy done. This is when a small piece of skin is removed for testing. How is this treated? Treatment for hives depends on the cause and on how severe your symptoms are. You may be told to use cool, wet cloths (cool compresses) or to take cool showers to relieve itching. Treatment may also include: Medicines to help: Relieve itching (antihistamines). Reduce swelling (corticosteroids). Treat infection (antibiotics). An injectable medicine called omalizumab. You may need this if you have chronic idiopathic hives and still have symptoms even after you are treated with antihistamines. In severe cases, you may need to use a device filled with medicine that gives an emergency shot of epinephrine (auto-injector pen) to prevent a very bad allergic reaction (anaphylactic reaction). Follow these instructions at home: Medicines Take and apply over-the-counter and prescription medicines only as told by your health care provider. If you were prescribed antibiotics, take them as told by your provider. Do not stop using the antibiotic even if you start to feel better. Skin care Apply cool compresses to the affected areas. Do not scratch or rub your skin. General instructions Do not take hot showers or baths. This can  make itching worse. Do not wear tight-fitting clothing. Use sunscreen. Wear protective clothing when you are outside. Avoid anything that causes your hives. Keep a journal to help track what causes your hives. Write down: What medicines you take. What you  eat and drink. What products you use on your skin. Keep all follow-up visits. Your provider will track how well treatment is working. Contact a health care provider if: Your symptoms do not get better with medicine. Your joints are painful or swollen. You have a fever. You have pain in your abdomen. Get help right away if: Your tongue, lips, or eyelids swell. Your chest or throat feels tight. You have trouble breathing or swallowing. These symptoms may be an emergency. Use the auto-injector pen right away. Then call 911. Do not wait to see if the symptoms will go away. Do not drive yourself to the hospital. This information is not intended to replace advice given to you by your health care provider. Make sure you discuss any questions you have with your health care provider. Document Revised: 03/20/2022 Document Reviewed: 03/11/2022 Elsevier Patient Education  2024 [employer].

## 2023-03-31 NOTE — Assessment & Plan Note (Signed)
Waxing and waning Denies need for lexapro 10mg . She d/c med >76months ago She plans to schedule appointment with psychologist

## 2023-03-31 NOTE — Assessment & Plan Note (Addendum)
Use of trazodone prn Prescription sent

## 2023-03-31 NOTE — Assessment & Plan Note (Signed)
Started wegovy injection 12/2022, prescribed by [facility] Provider Also had labs completed 12/2022: records requested Wt Readings from Last 3 Encounters:  03/31/23 154 lb 12.8 oz (70.2 kg)  04/30/22 162 lb 12.8 oz (73.8 kg)  04/16/22 166 lb 9.6 oz (75.6 kg)

## 2023-03-31 NOTE — Assessment & Plan Note (Signed)
She d/c lexapro 10mg >4months ago She opted to schedule appointment with psychologist

## 2023-03-31 NOTE — Assessment & Plan Note (Signed)
She states thyroid panel was completed by Integr[facility] medicine provider 3months ago. Advised to bring copy of lab results

## 2023-03-31 NOTE — Assessment & Plan Note (Signed)
Last hgbA1c at 5.9% She reports hgbA1c was completed 12/2022 by integrative medicine provider. Advised to have results faxed to me

## 2023-05-21 ENCOUNTER — Ambulatory Visit: Payer: Medicaid Other | Admitting: Internal Medicine

## 2023-06-08 NOTE — Progress Notes (Unsigned)
NEW PATIENT Date of Service/Encounter:  06/08/23 Referring provider: {Blank single:19197::"Nche, Shelley Gains, NP","none-self referred"} Primary care provider: Nche, Shelley Gains, NP  Subjective:  Shelley Bass is a 37 y.o. female with a PMHx of *** presenting today for evaluation of *** History obtained from: chart review and {Persons; PED relatives w/patient:19415::"patient"}.   Discussed the use of AI scribe software for clinical note transcription with the patient, who gave verbal consent to proceed.  History of Present Illness            Chart Review:  Reviewed PCP notes from referral 03/31/23:  Hives - Primary       Generalized hives with itching, Acute on chronic, waxing and waning. No hoarseness or cough or mouth/throat/facial swelling  Unknown trigger Reports she stopped eating seafood x 1week, but still have hives. Did not schedule appointment with allergist as recommended last year as recommended. Some improvement with Claritin and famotidine, but has been out of meds No improvement with vistaril-drowsiness      Other allergy screening: Asthma: {Blank single:19197::"yes","no"} Rhino conjunctivitis: {Blank single:19197::"yes","no"} Food allergy: {Blank single:19197::"yes","no"} Medication allergy: {Blank single:19197::"yes","no"} Hymenoptera allergy: {Blank single:19197::"yes","no"} Urticaria: {Blank single:19197::"yes","no"} Eczema:{Blank single:19197::"yes","no"} History of recurrent infections suggestive of immunodeficency: {Blank single:19197::"yes","no"} ***Vaccinations are up to date.   Past Medical History: Past Medical History:  Diagnosis Date   Abortion complicated with hemorrhage    Gestational diabetes    History of chlamydia    Ovarian cyst    Medication List:  Current Outpatient Medications  Medication Sig Dispense Refill   famotidine (PEPCID) 20 MG tablet Take 1 tablet (20 mg total) by mouth 2 (two) times daily. 180 tablet 0    loratadine (CLARITIN) 10 MG tablet Take 1 tablet (10 mg total) by mouth daily. 90 tablet 0   Semaglutide-Weight Management 0.25 MG/0.5ML SOAJ Inject 0.25 mg into the skin once a week.     traZODone (DESYREL) 50 MG tablet Take 0.5-1 tablets (25-50 mg total) by mouth at bedtime as needed for sleep. 30 tablet 5   No current facility-administered medications for this visit.   Known Allergies:  Allergies  Allergen Reactions   Aspirin Hives   Effexor [Venlafaxine] Itching   Paxil [Paroxetine] Itching   Past Surgical History: Past Surgical History:  Procedure Laterality Date   DILATION AND EVACUATION     post TAB for complications   INDUCED ABORTION     surgical   Family History: Family History  Problem Relation Age of Onset   Arthritis Mother    Miscarriages / [place] Mother    Hypertension Father    Vision loss Maternal Grandmother        cataracts   Diabetes Paternal Grandmother    Anesthesia problems Neg Hx    Hypotension Neg Hx    Malignant hyperthermia Neg Hx    Pseudochol deficiency Neg Hx    Social History: Shelley Bass lives ***.   ROS:  All other systems negative except as noted per HPI.  Objective:  There were no vitals taken for this visit. There is no height or weight on file to calculate BMI. Physical Exam:  General Appearance:  Alert, cooperative, no distress, appears stated age  Head:  Normocephalic, without obvious abnormality, atraumatic  Eyes:  Conjunctiva clear, EOM's intact  Ears {Blank multiple:19196:a:"***","EACs normal bilaterally","normal TMs bilaterally","ear tubes present bilaterally without exudate"}  Nose: Nares normal, {Blank multiple:19196:a:"***","hypertrophic turbinates","normal mucosa","no visible anterior polyps","septum midline"}  Throat: Lips, tongue normal; teeth and gums normal, {Blank multiple:19196:a:"***","normal posterior oropharynx","tonsils 2+","tonsils 3+","no tonsillar  exudate","+ cobblestoning","surgically absent  tonsils","mildly erythematous posterior oropharynx"}  Neck: Supple, symmetrical  Lungs:   {Blank multiple:19196:a:"***","clear to auscultation bilaterally","end-expiratory wheezing","wheezing throughout"}, Respirations unlabored, {Blank multiple:19196:a:"***","no coughing","intermittent dry coughing","intermittent productive-sounding cough"}  Heart:  {Blank multiple:19196:a:"***","regular rate and rhythm","no murmur"}, Appears well perfused  Extremities: No edema  Skin: {Blank multiple:19196:a:"***","erythematous, dry patches scattered on ***","lichenification on ***","Skin color, texture, turgor normal","no rashes or lesions on visualized portions of skin"}  Neurologic: No gross deficits   Diagnostics: Spirometry:  Tracings reviewed. Her effort: {Blank single:19197::"Good reproducible efforts.","It was hard to get consistent efforts and there is a question as to whether this reflects a maximal maneuver.","Poor effort, data can not be interpreted.","Variable effort-results affected","effort okay for first attempt at spirometry.","Results not reproducible due to ***"} FVC: ***L (pre), ***L  (post) FEV1: ***L, ***% predicted (pre), ***L, ***% predicted (post) FEV1/FVC ratio: *** (pre), *** (post) Interpretation: {Blank single:19197::"Spirometry consistent with mild obstructive disease","Spirometry consistent with moderate obstructive disease","Spirometry consistent with severe obstructive disease","Spirometry consistent with possible restrictive disease","Spirometry consistent with mixed obstructive and restrictive disease","Spirometry uninterpretable due to technique","Spirometry consistent with normal pattern","No overt abnormalities noted given today's efforts","Nonobstructive ratio, low FEV1","Nonobstructive ratio, low FEV1, possible restriction"}.  Please see scanned spirometry results for details.  Skin Testing: {Blank single:19197::"Select foods","Environmental allergy panel","Environmental  allergy panel and select foods","Food allergy panel","None","Deferred due to recent antihistamines use","deferred due to recent reaction","Pediatric Environmental Allergy Panel","Pediatric Food Panel","Select foods and environmental allergies"}. {Blank single:19197::"Adequate positive and negative controls","Inadequate positive control-testing invalid","Adequate positive and negative controls, dermatographism present, testing difficult to interpret"}. Results discussed with patient/family.   {Blank single:19197::"Allergy testing results were read and interpreted by myself, documented by clinical staff.","Allergy testing results were read by ***,FNP, documented by clinical staff"}  Labs:  Lab Orders  No laboratory test(s) ordered today     Assessment and Plan  Assessment and Plan               {Blank single:19197::"This note in its entirety was forwarded to the Provider who requested this consultation."}  Other: {Blank multiple:19196:a:"***","samples provided of: ***","school forms provided","reviewed spirometry technique","reviewed inhaler technique"}  Thank you for your kind referral. I appreciate the opportunity to take part in Chrisanna's care. Please do not hesitate to contact me with questions.***  Sincerely,  Tonny Bollman, MD [facility]

## 2023-06-09 ENCOUNTER — Ambulatory Visit: Payer: Medicaid Other | Admitting: Internal Medicine

## 2023-08-05 ENCOUNTER — Encounter: Payer: Self-pay | Admitting: Internal Medicine

## 2023-08-05 ENCOUNTER — Ambulatory Visit: Payer: Medicaid Other

## 2023-08-05 ENCOUNTER — Ambulatory Visit: Payer: Medicaid Other | Admitting: Internal Medicine

## 2023-08-05 VITALS — BP 116/72 | HR 74 | Temp 97.9°F | Ht 62.75 in | Wt 161.2 lb

## 2023-08-05 DIAGNOSIS — Z13220 Encounter for screening for lipoid disorders: Secondary | ICD-10-CM | POA: Diagnosis not present

## 2023-08-05 DIAGNOSIS — J101 Influenza due to other identified influenza virus with other respiratory manifestations: Secondary | ICD-10-CM | POA: Diagnosis not present

## 2023-08-05 DIAGNOSIS — Z111 Encounter for screening for respiratory tuberculosis: Secondary | ICD-10-CM

## 2023-08-05 DIAGNOSIS — Z0001 Encounter for general adult medical examination with abnormal findings: Secondary | ICD-10-CM

## 2023-08-05 DIAGNOSIS — Z0000 Encounter for general adult medical examination without abnormal findings: Secondary | ICD-10-CM

## 2023-08-05 LAB — POCT INFLUENZA A/B
Influenza A, POC: POSITIVE — AB
Influenza B, POC: NEGATIVE

## 2023-08-05 LAB — POC COVID19 BINAXNOW: SARS Coronavirus 2 Ag: NEGATIVE

## 2023-08-05 MED ORDER — PROMETHAZINE-DM 6.25-15 MG/5ML PO SYRP: 5.0000 mL | ORAL_SOLUTION | Freq: Four times a day (QID) | ORAL | 0 refills | Status: AC | PRN

## 2023-08-05 MED ORDER — OSELTAMIVIR PHOSPHATE 75 MG PO CAPS: 75.0000 mg | ORAL_CAPSULE | Freq: Two times a day (BID) | ORAL | 0 refills | Status: AC

## 2023-08-05 NOTE — Progress Notes (Signed)
Tuberculin skin test applied to Right forearm .

## 2023-08-05 NOTE — Patient Instructions (Signed)
Rest, drink plenty of fluids.  Tylenol for fever, body aches.    For nasal and sinus congestion: Use over-the-counter Flonase: 2 nasal sprays on each side of the nose in the morning until you feel better  Use over-the-counter Astepro 2 nasal sprays on each side of the nose twice daily until better  Patients without high blood pressure: can use decongestants such as pseudoephedrine or phenylephrine no more than 3-5 days.   Patients with high blood pressure can use Coricidin HBP for decongestant, it will not raise your blood pressure.   Call if not gradually better over the next  10 days  Call anytime if the symptoms are severe, you have high fever, short of breath, chest pain

## 2023-08-05 NOTE — Progress Notes (Signed)
Subjective:   Shelley Bass 01-01-1986  08/05/2023   CC: Chief Complaint  Patient presents with   Annual Exam    Not fasting    Generalized Body Aches    And not feeling well     HPI: Shelley Bass is a 38 y.o. female who presents for a routine health maintenance exam.  Patient is not fasting today, will obtain labs at follow up visit.   Discussed the use of AI scribe software for clinical note transcription with the patient, who gave verbal consent to proceed.  History of Present Illness   The patient, with a history of unspecified chronic conditions, presents with flu-like symptoms that started yesterday. She reports body aches, headache, and a cough. She has been managing her symptoms with Nyquil and chicken noodle soup. She denies fever, with a temperature of 98.77F this morning. She has been in close contact with her child, who has been ill with similar symptoms and a fever since Sunday. The child's fever broke yesterday, but she continues to have a cough and complain of a headache.    The patient is also due for an annual physical today and a Pap smear, which she plans to reschedule due to her current illness. 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 She does have work form to be completed.      HEALTH SCREENINGS: - Pap smear:  patient will return at later date for pap smear  - Mammogram (40+): Not applicable  - Colonoscopy (45+): Not applicable  - Bone Density (65+): Not applicable  - Lung CA screening with low-dose CT:  Not applicable Adults age 38-80 who are current cigarette smokers or quit within the last 15 years. Must have 20 pack year history.   Depression and Anxiety Screen done today and results listed below:     08/05/2023    3:06 PM 03/31/2023    2:03 PM 04/30/2022    8:03 AM 04/16/2022    2:33 PM 09/04/2021   10:06 AM  Depression screen PHQ 2/9  Decreased Interest 0 0 0 0 3  Down, Depressed,  Hopeless 0 1 0 0 3  PHQ - 2 Score 0 1 0 0 6  Altered sleeping 0 0   3  Tired, decreased energy 0 1   3  Change in appetite 0 1   3  Feeling bad or failure about yourself  0 0   3  Trouble concentrating 0 0   3  Moving slowly or fidgety/restless 0 0   3  Suicidal thoughts 0 0   3  PHQ-9 Score 0 3   27  Difficult doing work/chores Not difficult at all Not difficult at all   Very difficult      08/05/2023    3:06 PM 03/31/2023    2:03 PM 09/04/2021   10:06 AM 06/26/2021   12:11 PM  GAD 7 : Generalized Anxiety Score  Nervous, Anxious, on Edge 0 1 3 2   Control/stop worrying 0 0 3 2  Worry too much - different things 0 0 3 2  Trouble relaxing 0 1 3 2   Restless 0 0 3 2  Easily annoyed or irritable 0 0 3 2  Afraid - awful might happen 0 0 3 2  Total GAD 7 Score 0 2 21 14   Anxiety Difficulty Not difficult at all Not difficult at all Very difficult Somewhat difficult    IMMUNIZATIONS: -  Tdap: Tetanus vaccination status reviewed: tetanus status unknown to the patient. - Influenza:  postponed, patient ill   Past medical history, surgical history, medications, allergies, family history and social history reviewed with patient today and changes made to appropriate areas of the chart.   Past Medical History:  Diagnosis Date   Abortion complicated with hemorrhage    Gestational diabetes    History of chlamydia    Ovarian cyst     Past Surgical History:  Procedure Laterality Date   DILATION AND EVACUATION     post TAB for complications   INDUCED ABORTION     surgical    Current Outpatient Medications on File Prior to Visit  Medication Sig   loratadine (CLARITIN) 10 MG tablet Take 1 tablet (10 mg total) by mouth daily.   traZODone (DESYREL) 50 MG tablet Take 0.5-1 tablets (25-50 mg total) by mouth at bedtime as needed for sleep.   famotidine (PEPCID) 20 MG tablet Take 1 tablet (20 mg total) by mouth 2 (two) times daily. (Patient not taking: Reported on 08/05/2023)    Semaglutide-Weight Management 0.25 MG/0.5ML SOAJ Inject 0.25 mg into the skin once a week. (Patient not taking: Reported on 08/05/2023)   No current facility-administered medications on file prior to visit.    Allergies  Allergen Reactions   Aspirin Hives   Effexor [Venlafaxine] Itching   Paxil [Paroxetine] Itching     Social History   Socioeconomic History   Marital status: Single    Spouse name: Not on file   Number of children: Not on file   Years of education: Not on file   Highest education level: Not on file  Occupational History   Not on file  Tobacco Use   Smoking status: Never   Smokeless tobacco: Never  Vaping Use   Vaping status: Never Used  Substance and Sexual Activity   Alcohol use: No   Drug use: No   Sexual activity: Yes    Birth control/protection: None  Other Topics Concern   Not on file  Social History Narrative   Not on file   Social Drivers of Health   Financial Resource Strain: Not on file  Food Insecurity: Not on file  Transportation Needs: Not on file  Physical Activity: Not on file  Stress: Not on file  Social Connections: Not on file  Intimate Partner Violence: Not on file   Social History   Tobacco Use  Smoking Status Never  Smokeless Tobacco Never   Social History   Substance and Sexual Activity  Alcohol Use No    Family History  Problem Relation Age of Onset   Arthritis Mother    Miscarriages / [place] Mother    Hypertension Father    Vision loss Maternal Grandmother        cataracts   Diabetes Paternal Grandmother    Anesthesia problems Neg Hx    Hypotension Neg Hx    Malignant hyperthermia Neg Hx    Pseudochol deficiency Neg Hx      ROS: Denies fever,  unexplained weight loss/gain, hearing or vision changes, cardiac complaints. Denies neurological deficits, musculoskeletal complaints, gastrointestinal or genitourinary complaints, mental health complaints, and skin changes.   Objective:   Today's Vitals    08/05/23 1436  BP: 116/72  Pulse: 74  Temp: 97.9 F (36.6 C)  TempSrc: Temporal  SpO2: 94%  Weight: 161 lb 3.2 oz (73.1 kg)  Height: 5' 2.75" (1.594 m)   Vision Screening   Right eye Left eye  Both eyes  Without correction     With correction 20/13 20/15 20/20      GENERAL APPEARANCE: ill-appearing, non-toxic. in NAD. Well nourished.  SKIN: Pink, warm and dry. Turgor normal. No rash, lesion, ulceration, or ecchymoses. Hair evenly distributed.  HEENT: HEAD: Normocephalic.  EYES: PERRLA. EOMI. Lids intact w/o defect. Sclera white, Conjunctiva pink w/o exudate.  EARS: External ear w/o redness, swelling, masses or lesions. EAC clear. TM's intact, translucent w/o bulging, appropriate landmarks visualized. Appropriate acuity to conversational tones.  NOSE: Septum midline w/o deformity. Nares patent, mucosa pink and non-inflamed w/o drainage. No sinus tenderness.  THROAT: Uvula midline. Oropharynx erythematous. Tonsils non-inflamed w/o exudate. Oral mucosa pink and moist.  NECK: Supple, Trachea midline. Full ROM w/o pain or tenderness. No lymphadenopathy. Thyroid non-tender w/o enlargement or palpable masses.  RESPIRATORY: Chest wall symmetrical w/o masses. Respirations even and non-labored. Breath sounds clear to auscultation bilaterally. No wheezes, rales, rhonchi, or crackles. (+)cough CARDIAC: S1, S2 present, regular rate and rhythm. No gallops, murmurs, rubs, or clicks.Capillary refill <2 seconds. Peripheral pulses 2+ bilaterally. GI: Abdomen soft w/o distention. Normoactive bowel sounds. No palpable masses or tenderness. No guarding or rebound tenderness. Liver and spleen w/o tenderness or enlargement. No CVA tenderness.  MSK: Muscle tone and strength appropriate for age, w/o atrophy or abnormal movement.  EXTREMITIES: Active ROM intact, w/o tenderness, crepitus, or contracture. No obvious joint deformities or effusions. No clubbing, edema, or cyanosis.  NEUROLOGIC: CN's II-XII intact.  Motor strength symmetrical with no obvious weakness. No sensory deficits. Steady, even gait.  PSYCH/MENTAL STATUS: Alert, oriented x 3. Cooperative, appropriate mood and affect.   Results for orders placed or performed in visit on 08/05/23  POC COVID-19 BinaxNow   Collection Time: 08/05/23  3:23 PM  Result Value Ref Range   SARS Coronavirus 2 Ag Negative Negative  POCT Influenza A/B   Collection Time: 08/05/23  3:23 PM  Result Value Ref Range   Influenza A, POC Positive (A) Negative   Influenza B, POC Negative Negative     Assessment & Plan:  Annual physical exam -     CBC with Differential/Platelet; Future -     Comprehensive metabolic panel; Future -     Lipid panel; Future -     TSH; Future  Influenza A -     Oseltamivir Phosphate; Take 1 capsule (75 mg total) by mouth 2 (two) times daily.  Dispense: 10 capsule; Refill: 0 -     Promethazine-DM; Take 5 mLs by mouth 4 (four) times daily as needed for cough.  Dispense: 180 mL; Refill: 0 -     POC COVID-19 BinaxNow -     POCT Influenza A/B  Screening for tuberculosis -     TB Skin Test  - patient was given 0.38ml of tuberculin into right forearm by Mary Sella CMA  Orders Placed This Encounter  Procedures   CBC with Differential/Platelet    Standing Status:   Future    Expiration Date:   02/02/2024   Comprehensive metabolic panel    Standing Status:   Future    Expiration Date:   02/02/2024   Lipid panel    Standing Status:   Future    Expiration Date:   02/02/2024   TSH    Standing Status:   Future    Expiration Date:   02/02/2024   PPD    Has patient ever tested positive?:   No   POC COVID-19 BinaxNow    Previously tested for COVID-19:  Unknown    Resident in a congregate (group) care setting:   Unknown    Employed in healthcare setting:   Unknown    Pregnant:   Unknown   POCT Influenza A/B    PATIENT COUNSELING:  - Encouraged a healthy well-balanced diet. Patient may adjust caloric intake to maintain or  achieve ideal body weight. May reduce intake of dietary saturated fat and total fat and have adequate dietary potassium and calcium preferably from fresh fruits, vegetables, and low-fat dairy products.   - Advised to avoid cigarette smoking. - Discussed with the patient that most people either abstain from alcohol or drink within safe limits (<=14/week and <=4 drinks/occasion for males, <=7/weeks and <= 3 drinks/occasion for females) and that the risk for alcohol disorders and other health effects rises proportionally with the number of drinks per week and how often a drinker exceeds daily limits. - Discussed cessation/primary prevention of drug use and availability of treatment for abuse.  - Discussed sexually transmitted diseases, avoidance of unintended pregnancy and contraceptive alternatives.  - Stressed the importance of regular exercise - Injury prevention: Discussed safety belts, safety helmets, smoke detector, smoking near bedding or upholstery.  - Dental health: Discussed importance of regular tooth brushing, flossing, and dental visits.   NEXT PREVENTATIVE PHYSICAL DUE IN 1 YEAR.  Return in about 2 days (around 08/07/2023) for fasting labs, pap smear, tdap , ppd reading.  Salvatore Decent, FNP

## 2023-08-06 NOTE — Progress Notes (Signed)
[facility] [address] [place] [address] Dept: [phone] Dept Fax: [phone]  Acute Care Office Visit  Subjective:   Arina Torry 1985-09-12 08/07/2023  Chief Complaint  Patient presents with   Follow-up    HPI: Sharis Keeran presents for routine Pap smear, tuberculin skin test reading, Tdap administration, and fasting lab work.   The following portions of the patient's history were reviewed and updated as appropriate: past medical history, past surgical history, family history, social history, allergies, medications, and problem list.   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   Impaired fasting glucose 03/31/2023   Hives 04/16/2022   Overweight 04/16/2022   GAD (generalized anxiety disorder) 09/04/2021   Depression, recurrent (HCC) 06/26/2021   Low TSH level 06/18/2019   Past Medical History:  Diagnosis Date   Abortion complicated with hemorrhage    Gestational diabetes    History of chlamydia    Ovarian cyst    Past Surgical History:  Procedure Laterality Date   DILATION AND EVACUATION     post TAB for complications   INDUCED ABORTION     surgical   Family History  Problem Relation Age of Onset   Arthritis Mother    Miscarriages / [place] Mother    Hypertension Father    Vision loss Maternal Grandmother        cataracts   Diabetes Paternal Grandmother    Anesthesia problems Neg Hx    Hypotension Neg Hx    Malignant hyperthermia Neg Hx    Pseudochol deficiency Neg Hx     Current Outpatient Medications:    famotidine (PEPCID) 20 MG tablet, Take 1 tablet (20 mg total) by mouth 2 (two) times daily., Disp: 180 tablet, Rfl: 0   loratadine (CLARITIN) 10 MG tablet, Take 1 tablet (10 mg total) by mouth daily., Disp: 90 tablet, Rfl: 0   oseltamivir (TAMIFLU) 75 MG capsule, Take 1 capsule (75 mg total) by mouth 2 (two) times daily., Disp: 10  capsule, Rfl: 0   promethazine-dextromethorphan (PROMETHAZINE-DM) 6.25-15 MG/5ML syrup, Take 5 mLs by mouth 4 (four) times daily as needed for cough., Disp: 180 mL, Rfl: 0   Semaglutide-Weight Management 0.25 MG/0.5ML SOAJ, Inject 0.25 mg into the skin once a week., Disp: , Rfl:    traZODone (DESYREL) 50 MG tablet, Take 0.5-1 tablets (25-50 mg total) by mouth at bedtime as needed for sleep., Disp: 30 tablet, Rfl: 5 Allergies  Allergen Reactions   Aspirin Hives   Effexor [Venlafaxine] Itching   Paxil [Paroxetine] Itching     ROS: A complete ROS was performed with pertinent positives/negatives noted in the HPI. The remainder of the ROS are negative.    Objective:   Today's Vitals   08/07/23 0908  BP: 124/80  Pulse: 68  Temp: 98 F (36.7 C)  TempSrc: Temporal  SpO2: 98%  Weight: 159 lb 1 oz (72.1 kg)  Height: 5' 2.75" (1.594 m)    GENERAL: Well-appearing, in NAD. Well nourished.  SKIN: Pink, warm and dry.  RESPIRATORY: Chest wall symmetrical. Respirations even and non-labored.  GU: External genitalia without erythema, lesions, or masses. No lymphadenopathy. Vaginal mucosa pink and moist without exudate, lesions, or ulcerations. Cervix pink without discharge. Cervical os closed. Uterus and adnexae palpable, not enlarged, and w/o tenderness. No palpable masses. Chaperoned by Mary Sella CMA EXTREMITIES: Without clubbing, cyanosis, or edema.  NEUROLOGIC: Steady, even gait.  PSYCH/MENTAL STATUS: Alert, oriented x 3. Cooperative,  appropriate mood and affect.    Results for orders placed or performed in visit on 08/07/23  TB Skin Test  Result Value Ref Range   TB Skin Test Negative    Induration 0 mm      Assessment & Plan:   Cervical cancer screening -     Cytology - PAP  Immunization due -     Tdap vaccine greater than or equal to 7yo IM  Screening for tuberculosis -     TB Skin Test   Work form completed and handed to patient.  No orders of the defined types were  placed in this encounter.  Orders Placed This Encounter  Procedures   Tdap vaccine greater than or equal to 7yo IM   TB Skin Test    Has patient ever tested positive?:   No   Lab Orders         TB Skin Test     No images are attached to the encounter or orders placed in the encounter.  Return in about 1 year (around 08/06/2024) for Fasting Annual Physical Exam.   Salvatore Decent, FNP

## 2023-08-07 ENCOUNTER — Encounter: Payer: Medicaid Other | Admitting: Family Medicine

## 2023-08-07 ENCOUNTER — Ambulatory Visit: Payer: 59 | Admitting: Internal Medicine

## 2023-08-07 ENCOUNTER — Encounter: Payer: Self-pay | Admitting: Internal Medicine

## 2023-08-07 ENCOUNTER — Other Ambulatory Visit (HOSPITAL_COMMUNITY)
Admission: RE | Admit: 2023-08-07 | Discharge: 2023-08-07 | Disposition: A | Discharge status: Home / Self Care | Payer: 59 | Source: Ambulatory Visit | Attending: Internal Medicine | Admitting: Internal Medicine

## 2023-08-07 VITALS — BP 124/80 | HR 68 | Temp 98.0°F | Ht 62.75 in | Wt 159.1 lb

## 2023-08-07 DIAGNOSIS — Z124 Encounter for screening for malignant neoplasm of cervix: Secondary | ICD-10-CM | POA: Diagnosis present

## 2023-08-07 DIAGNOSIS — Z0000 Encounter for general adult medical examination without abnormal findings: Secondary | ICD-10-CM | POA: Diagnosis not present

## 2023-08-07 DIAGNOSIS — Z111 Encounter for screening for respiratory tuberculosis: Secondary | ICD-10-CM

## 2023-08-07 DIAGNOSIS — Z13220 Encounter for screening for lipoid disorders: Secondary | ICD-10-CM | POA: Diagnosis not present

## 2023-08-07 DIAGNOSIS — Z23 Encounter for immunization: Secondary | ICD-10-CM | POA: Diagnosis not present

## 2023-08-07 LAB — COMPREHENSIVE METABOLIC PANEL
ALT: 20 U/L (ref 0–35)
AST: 24 U/L (ref 0–37)
Albumin: 4.5 g/dL (ref 3.5–5.2)
Alkaline Phosphatase: 39 U/L (ref 39–117)
BUN: 11 mg/dL (ref 6–23)
CO2: 29 meq/L (ref 19–32)
Calcium: 9 mg/dL (ref 8.4–10.5)
Chloride: 101 meq/L (ref 96–112)
Creatinine, Ser: 0.96 mg/dL (ref 0.40–1.20)
GFR: 75.84 mL/min (ref >60.00)
Glucose, Bld: 86 mg/dL (ref 70–99)
Potassium: 3.5 meq/L (ref 3.5–5.1)
Sodium: 138 meq/L (ref 135–145)
Total Bilirubin: 0.5 mg/dL (ref 0.2–1.2)
Total Protein: 8 g/dL (ref 6.0–8.3)

## 2023-08-07 LAB — CBC WITH DIFFERENTIAL/PLATELET
Basophils Absolute: 0 10*3/uL (ref 0.0–0.1)
Basophils Relative: 0.4 % (ref 0.0–3.0)
Eosinophils Absolute: 0 10*3/uL (ref 0.0–0.7)
Eosinophils Relative: 1.2 % (ref 0.0–5.0)
HCT: 41.4 % (ref 36.0–46.0)
Hemoglobin: 13.6 g/dL (ref 12.0–15.0)
Lymphocytes Relative: 61.1 % — ABNORMAL HIGH (ref 12.0–46.0)
Lymphs Abs: 1.5 10*3/uL (ref 0.7–4.0)
MCHC: 32.9 g/dL (ref 30.0–36.0)
MCV: 94.3 fL (ref 78.0–100.0)
Monocytes Absolute: 0.3 10*3/uL (ref 0.1–1.0)
Monocytes Relative: 13.4 % — ABNORMAL HIGH (ref 3.0–12.0)
Neutro Abs: 0.6 10*3/uL — ABNORMAL LOW (ref 1.4–7.7)
Neutrophils Relative %: 23.9 % — ABNORMAL LOW (ref 43.0–77.0)
Platelets: 182 10*3/uL (ref 150.0–400.0)
RBC: 4.39 Mil/uL (ref 3.87–5.11)
RDW: 13.9 % (ref 11.5–15.5)
WBC: 2.5 10*3/uL — ABNORMAL LOW (ref 4.0–10.5)

## 2023-08-07 LAB — LIPID PANEL
Cholesterol: 118 mg/dL (ref 0–200)
HDL: 33.5 mg/dL — ABNORMAL LOW (ref >39.00)
LDL Cholesterol: 61 mg/dL (ref 0–99)
NonHDL: 84.42
Total CHOL/HDL Ratio: 4
Triglycerides: 115 mg/dL (ref 0.0–149.0)
VLDL: 23 mg/dL (ref 0.0–40.0)

## 2023-08-07 LAB — TB SKIN TEST
Induration: 0 mm
TB Skin Test: NEGATIVE

## 2023-08-07 LAB — TSH: TSH: 0.64 u[IU]/mL (ref 0.35–5.50)

## 2023-08-10 ENCOUNTER — Other Ambulatory Visit: Payer: Self-pay | Admitting: Internal Medicine

## 2023-08-10 DIAGNOSIS — R7989 Other specified abnormal findings of blood chemistry: Secondary | ICD-10-CM

## 2023-08-12 LAB — CYTOLOGY - PAP
Comment: NEGATIVE
Comment: NEGATIVE
Comment: NEGATIVE
Diagnosis: HIGH — AB
HPV 16: NEGATIVE
HPV 18 / 45: NEGATIVE
High risk HPV: POSITIVE — AB

## 2023-08-19 ENCOUNTER — Other Ambulatory Visit: Payer: Self-pay | Admitting: Internal Medicine

## 2023-08-19 ENCOUNTER — Encounter: Payer: Self-pay | Admitting: Internal Medicine

## 2023-08-25 ENCOUNTER — Other Ambulatory Visit: Payer: Self-pay | Admitting: Internal Medicine

## 2023-08-25 DIAGNOSIS — R87613 High grade squamous intraepithelial lesion on cytologic smear of cervix (HGSIL): Secondary | ICD-10-CM | POA: Insufficient documentation

## 2023-08-25 DIAGNOSIS — R87810 Cervical high risk human papillomavirus (HPV) DNA test positive: Secondary | ICD-10-CM | POA: Insufficient documentation

## 2023-08-27 ENCOUNTER — Encounter: Payer: Self-pay | Admitting: Internal Medicine

## 2023-08-27 NOTE — Telephone Encounter (Signed)
Pt read message that was sent to MyChart regarding pap results.

## 2023-12-28 ENCOUNTER — Telehealth: Payer: Self-pay

## 2023-12-28 NOTE — Telephone Encounter (Signed)
 Patient was informed of abnormal pap and was given referral contact info to schedule app

## 2023-12-29 NOTE — Telephone Encounter (Signed)
 error

## 2024-02-11 ENCOUNTER — Telehealth: Payer: Self-pay

## 2024-02-11 NOTE — Telephone Encounter (Signed)
 Left VM for patient to return call to office about open referral to [facility]

## 2024-08-04 ENCOUNTER — Encounter: Payer: Self-pay | Admitting: Internal Medicine
